# Patient Record
Sex: Male | Born: 1945 | Race: White | Hispanic: No | Smoking: Current every day smoker
Health system: Southern US, Community
[De-identification: ages and names within clinical notes are randomized; demographics above are authoritative.]

## PROBLEM LIST (undated history)

## (undated) DIAGNOSIS — J449 Chronic obstructive pulmonary disease, unspecified: Secondary | ICD-10-CM

## (undated) DIAGNOSIS — I10 Essential (primary) hypertension: Secondary | ICD-10-CM

## (undated) HISTORY — PX: BACK SURGERY: SHX140

## (undated) HISTORY — PX: PERCUTANEOUS PLACEMENT INTRAVASCULAR STENT CERVICAL CAROTID ARTERY: SUR1019

## (undated) HISTORY — PX: CARDIAC SURGERY: SHX584

## (undated) HISTORY — PX: CORONARY ARTERY BYPASS GRAFT: SHX141

---

## 2013-07-26 DEATH — deceased

## 2016-06-05 ENCOUNTER — Inpatient Hospital Stay (HOSPITAL_COMMUNITY)
Admission: EM | Admit: 2016-06-05 | Discharge: 2016-06-10 | DRG: 154 | Disposition: A | Discharge status: Home / Self Care | Payer: Medicare Other | Attending: Internal Medicine | Admitting: Internal Medicine

## 2016-06-05 ENCOUNTER — Emergency Department (HOSPITAL_COMMUNITY): Payer: Medicare Other

## 2016-06-05 ENCOUNTER — Encounter (HOSPITAL_COMMUNITY): Payer: Self-pay | Admitting: Emergency Medicine

## 2016-06-05 DIAGNOSIS — G9340 Encephalopathy, unspecified: Secondary | ICD-10-CM | POA: Diagnosis not present

## 2016-06-05 DIAGNOSIS — G92 Toxic encephalopathy: Secondary | ICD-10-CM | POA: Diagnosis present

## 2016-06-05 DIAGNOSIS — J988 Other specified respiratory disorders: Secondary | ICD-10-CM

## 2016-06-05 DIAGNOSIS — T380X5A Adverse effect of glucocorticoids and synthetic analogues, initial encounter: Secondary | ICD-10-CM | POA: Diagnosis present

## 2016-06-05 DIAGNOSIS — J029 Acute pharyngitis, unspecified: Secondary | ICD-10-CM | POA: Diagnosis present

## 2016-06-05 DIAGNOSIS — E876 Hypokalemia: Secondary | ICD-10-CM | POA: Diagnosis present

## 2016-06-05 DIAGNOSIS — J432 Centrilobular emphysema: Secondary | ICD-10-CM | POA: Diagnosis present

## 2016-06-05 DIAGNOSIS — J449 Chronic obstructive pulmonary disease, unspecified: Secondary | ICD-10-CM | POA: Diagnosis present

## 2016-06-05 DIAGNOSIS — I6529 Occlusion and stenosis of unspecified carotid artery: Secondary | ICD-10-CM | POA: Diagnosis present

## 2016-06-05 DIAGNOSIS — Z880 Allergy status to penicillin: Secondary | ICD-10-CM | POA: Diagnosis not present

## 2016-06-05 DIAGNOSIS — Z01818 Encounter for other preprocedural examination: Secondary | ICD-10-CM

## 2016-06-05 DIAGNOSIS — F17200 Nicotine dependence, unspecified, uncomplicated: Secondary | ICD-10-CM | POA: Diagnosis not present

## 2016-06-05 DIAGNOSIS — Z885 Allergy status to narcotic agent status: Secondary | ICD-10-CM | POA: Diagnosis not present

## 2016-06-05 DIAGNOSIS — A419 Sepsis, unspecified organism: Secondary | ICD-10-CM | POA: Diagnosis not present

## 2016-06-05 DIAGNOSIS — R061 Stridor: Secondary | ICD-10-CM

## 2016-06-05 DIAGNOSIS — F17210 Nicotine dependence, cigarettes, uncomplicated: Secondary | ICD-10-CM | POA: Diagnosis present

## 2016-06-05 DIAGNOSIS — R1310 Dysphagia, unspecified: Secondary | ICD-10-CM | POA: Diagnosis present

## 2016-06-05 DIAGNOSIS — J392 Other diseases of pharynx: Secondary | ICD-10-CM | POA: Diagnosis present

## 2016-06-05 DIAGNOSIS — R0600 Dyspnea, unspecified: Secondary | ICD-10-CM | POA: Diagnosis not present

## 2016-06-05 DIAGNOSIS — Z23 Encounter for immunization: Secondary | ICD-10-CM

## 2016-06-05 DIAGNOSIS — Z9911 Dependence on respirator [ventilator] status: Secondary | ICD-10-CM | POA: Diagnosis not present

## 2016-06-05 DIAGNOSIS — I110 Hypertensive heart disease with heart failure: Secondary | ICD-10-CM | POA: Diagnosis present

## 2016-06-05 DIAGNOSIS — I5022 Chronic systolic (congestive) heart failure: Secondary | ICD-10-CM | POA: Diagnosis present

## 2016-06-05 DIAGNOSIS — J9601 Acute respiratory failure with hypoxia: Secondary | ICD-10-CM | POA: Diagnosis present

## 2016-06-05 DIAGNOSIS — IMO0001 Reserved for inherently not codable concepts without codable children: Secondary | ICD-10-CM | POA: Diagnosis present

## 2016-06-05 DIAGNOSIS — J42 Unspecified chronic bronchitis: Secondary | ICD-10-CM | POA: Diagnosis not present

## 2016-06-05 DIAGNOSIS — Z9289 Personal history of other medical treatment: Secondary | ICD-10-CM

## 2016-06-05 DIAGNOSIS — I6522 Occlusion and stenosis of left carotid artery: Secondary | ICD-10-CM

## 2016-06-05 HISTORY — DX: Essential (primary) hypertension: I10

## 2016-06-05 HISTORY — DX: Chronic obstructive pulmonary disease, unspecified: J44.9

## 2016-06-05 LAB — CBC WITH DIFFERENTIAL/PLATELET
Band Neutrophils: 0 %
Basophils Absolute: 0 10*3/uL (ref 0.0–0.1)
Basophils Relative: 0 %
Blasts: 0 %
Eosinophils Absolute: 0 10*3/uL (ref 0.0–0.7)
Eosinophils Relative: 0 %
HCT: 37.1 % — ABNORMAL LOW (ref 39.0–52.0)
Hemoglobin: 12.3 g/dL — ABNORMAL LOW (ref 13.0–17.0)
Lymphocytes Relative: 17 %
Lymphs Abs: 5 10*3/uL — ABNORMAL HIGH (ref 0.7–4.0)
MCH: 28.6 pg (ref 26.0–34.0)
MCHC: 33.2 g/dL (ref 30.0–36.0)
MCV: 86.3 fL (ref 78.0–100.0)
Metamyelocytes Relative: 0 %
Monocytes Absolute: 2.3 10*3/uL — ABNORMAL HIGH (ref 0.1–1.0)
Monocytes Relative: 8 %
Myelocytes: 0 %
Neutro Abs: 21.9 10*3/uL — ABNORMAL HIGH (ref 1.7–7.7)
Neutrophils Relative %: 75 %
Platelets: 520 10*3/uL — ABNORMAL HIGH (ref 150–400)
Promyelocytes Absolute: 0 %
RBC: 4.3 MIL/uL (ref 4.22–5.81)
RDW: 13.5 % (ref 11.5–15.5)
WBC: 29.2 10*3/uL — ABNORMAL HIGH (ref 4.0–10.5)
nRBC: 0 /100 WBC

## 2016-06-05 LAB — BLOOD GAS, ARTERIAL
Acid-Base Excess: 3.7 mmol/L — ABNORMAL HIGH (ref 0.0–2.0)
Bicarbonate: 27.8 mmol/L (ref 20.0–28.0)
Drawn by: 27407
FIO2: 40
MECHVT: 400 mL
O2 Saturation: 97.9 %
PEEP: 5 cmH2O
Patient temperature: 37
RATE: 26 resp/min
pCO2 arterial: 39.4 mmHg (ref 32.0–48.0)
pH, Arterial: 7.457 — ABNORMAL HIGH (ref 7.350–7.450)
pO2, Arterial: 120 mmHg — ABNORMAL HIGH (ref 83.0–108.0)

## 2016-06-05 LAB — BASIC METABOLIC PANEL
Anion gap: 12 (ref 5–15)
BUN: 25 mg/dL — ABNORMAL HIGH (ref 6–20)
CO2: 29 mmol/L (ref 22–32)
Calcium: 7 mg/dL — ABNORMAL LOW (ref 8.9–10.3)
Chloride: 94 mmol/L — ABNORMAL LOW (ref 101–111)
Creatinine, Ser: 1.01 mg/dL (ref 0.61–1.24)
GFR calc Af Amer: >60 mL/min (ref >60)
GFR calc non Af Amer: >60 mL/min (ref >60)
Glucose, Bld: 90 mg/dL (ref 65–99)
Potassium: 2.9 mmol/L — ABNORMAL LOW (ref 3.5–5.1)
Sodium: 135 mmol/L (ref 135–145)

## 2016-06-05 LAB — MAGNESIUM: MAGNESIUM: 0.5 mg/dL — AB (ref 1.7–2.4)

## 2016-06-05 LAB — PHOSPHORUS: PHOSPHORUS: 4 mg/dL (ref 2.5–4.6)

## 2016-06-05 MED ORDER — PROPOFOL 1000 MG/100ML IV EMUL
INTRAVENOUS | Status: AC
Start: 2016-06-05 — End: 2016-06-05
  Filled 2016-06-05: qty 100

## 2016-06-05 MED ORDER — FAMOTIDINE IN NACL 20-0.9 MG/50ML-% IV SOLN
20.0000 mg | Freq: Two times a day (BID) | INTRAVENOUS | Status: DC
  Administered 2016-06-05 – 2016-06-07 (×4): 20 mg via INTRAVENOUS
  Filled 2016-06-05 (×4): qty 50

## 2016-06-05 MED ORDER — IPRATROPIUM-ALBUTEROL 0.5-2.5 (3) MG/3ML IN SOLN
3.0000 mL | Freq: Four times a day (QID) | RESPIRATORY_TRACT | Status: DC
  Administered 2016-06-05 – 2016-06-10 (×18): 3 mL via RESPIRATORY_TRACT
  Filled 2016-06-05 (×18): qty 3

## 2016-06-05 MED ORDER — METHYLPREDNISOLONE SODIUM SUCC 125 MG IJ SOLR
INTRAMUSCULAR | Status: AC
  Administered 2016-06-05: 125 mg via INTRAVENOUS
  Filled 2016-06-05: qty 2

## 2016-06-05 MED ORDER — MAGNESIUM SULFATE 2 GM/50ML IV SOLN
2.0000 g | Freq: Once | INTRAVENOUS | Status: AC
  Administered 2016-06-05: 2 g via INTRAVENOUS
  Filled 2016-06-05: qty 50

## 2016-06-05 MED ORDER — DIPHENHYDRAMINE HCL 50 MG/ML IJ SOLN
INTRAMUSCULAR | Status: AC
  Administered 2016-06-05: 50 mg via INTRAVENOUS
  Filled 2016-06-05: qty 1

## 2016-06-05 MED ORDER — IOPAMIDOL (ISOVUE-300) INJECTION 61%
75.0000 mL | Freq: Once | INTRAVENOUS | Status: AC | PRN
  Administered 2016-06-05: 75 mL via INTRAVENOUS

## 2016-06-05 MED ORDER — SODIUM CHLORIDE 0.9 % IV SOLN
25.0000 ug/h | INTRAVENOUS | Status: DC
  Filled 2016-06-05: qty 50

## 2016-06-05 MED ORDER — PROPOFOL 1000 MG/100ML IV EMUL
INTRAVENOUS | Status: AC
  Administered 2016-06-05: 5 ug/kg/min via INTRAVENOUS
  Filled 2016-06-05: qty 100

## 2016-06-05 MED ORDER — KETAMINE HCL 10 MG/ML IJ SOLN
INTRAMUSCULAR | Status: AC
  Administered 2016-06-05: 60 mg via INTRAVENOUS
  Filled 2016-06-05: qty 1

## 2016-06-05 MED ORDER — AZTREONAM 1 G IJ SOLR
1.0000 g | Freq: Three times a day (TID) | INTRAMUSCULAR | Status: DC
  Administered 2016-06-05 – 2016-06-09 (×12): 1 g via INTRAVENOUS
  Filled 2016-06-05 (×19): qty 1

## 2016-06-05 MED ORDER — CLINDAMYCIN PHOSPHATE 600 MG/50ML IV SOLN
600.0000 mg | Freq: Once | INTRAVENOUS | Status: AC
  Administered 2016-06-05: 600 mg via INTRAVENOUS
  Filled 2016-06-05: qty 50

## 2016-06-05 MED ORDER — MIDAZOLAM BOLUS VIA INFUSION
1.0000 mg | INTRAVENOUS | Status: DC | PRN
  Filled 2016-06-05: qty 1

## 2016-06-05 MED ORDER — DEXTROSE 5 % IV SOLN
INTRAVENOUS | Status: AC
  Filled 2016-06-05 (×2): qty 1

## 2016-06-05 MED ORDER — LORAZEPAM 2 MG/ML IJ SOLN: 1.0000 mg | Freq: Once | INTRAMUSCULAR | Status: DC

## 2016-06-05 MED ORDER — VANCOMYCIN HCL IN DEXTROSE 1-5 GM/200ML-% IV SOLN
INTRAVENOUS | Status: AC
  Filled 2016-06-05: qty 200

## 2016-06-05 MED ORDER — MAGNESIUM SULFATE 2 GM/50ML IV SOLN
4.0000 g | INTRAVENOUS | Status: AC
  Filled 2016-06-05: qty 100

## 2016-06-05 MED ORDER — ENOXAPARIN SODIUM 40 MG/0.4ML ~~LOC~~ SOLN
40.0000 mg | SUBCUTANEOUS | Status: DC
  Administered 2016-06-05 – 2016-06-09 (×5): 40 mg via SUBCUTANEOUS
  Filled 2016-06-05 (×5): qty 0.4

## 2016-06-05 MED ORDER — MAGNESIUM SULFATE 4 GM/100ML IV SOLN
4.0000 g | INTRAVENOUS | Status: DC
  Administered 2016-06-05 – 2016-06-06 (×2): 4 g via INTRAVENOUS
  Filled 2016-06-05 (×2): qty 100

## 2016-06-05 MED ORDER — ONDANSETRON HCL 4 MG/2ML IJ SOLN: 4.0000 mg | Freq: Four times a day (QID) | INTRAMUSCULAR | Status: DC | PRN

## 2016-06-05 MED ORDER — FAMOTIDINE IN NACL 20-0.9 MG/50ML-% IV SOLN
20.0000 mg | Freq: Once | INTRAVENOUS | Status: AC
  Administered 2016-06-05: 20 mg via INTRAVENOUS
  Filled 2016-06-05: qty 50

## 2016-06-05 MED ORDER — SODIUM CHLORIDE 0.9 % IV SOLN
INTRAVENOUS | Status: DC
  Administered 2016-06-05: 14:00:00 via INTRAVENOUS

## 2016-06-05 MED ORDER — SODIUM CHLORIDE 0.9 % IV BOLUS (SEPSIS)
1000.0000 mL | Freq: Once | INTRAVENOUS | Status: AC
  Administered 2016-06-05: 1000 mL via INTRAVENOUS

## 2016-06-05 MED ORDER — VANCOMYCIN HCL IN DEXTROSE 1-5 GM/200ML-% IV SOLN
1000.0000 mg | Freq: Once | INTRAVENOUS | Status: AC
  Administered 2016-06-05: 1000 mg via INTRAVENOUS

## 2016-06-05 MED ORDER — DIPHENHYDRAMINE HCL 50 MG/ML IJ SOLN
50.0000 mg | Freq: Once | INTRAMUSCULAR | Status: AC
  Administered 2016-06-05: 50 mg via INTRAVENOUS

## 2016-06-05 MED ORDER — SODIUM CHLORIDE 0.9 % IV SOLN
1.0000 mg/h | INTRAVENOUS | Status: DC
Start: 2016-06-05 — End: 2016-06-07
  Administered 2016-06-05: 2 mg/h via INTRAVENOUS
  Administered 2016-06-05: 1 mg/h via INTRAVENOUS
  Administered 2016-06-06: 7 mg/h via INTRAVENOUS
  Administered 2016-06-06: 6 mg/h via INTRAVENOUS
  Administered 2016-06-06: 1 mg/h via INTRAVENOUS
  Administered 2016-06-06 (×2): 6 mg/h via INTRAVENOUS
  Administered 2016-06-07: 7 mg/h via INTRAVENOUS
  Filled 2016-06-05 (×4): qty 10

## 2016-06-05 MED ORDER — ROCURONIUM BROMIDE 50 MG/5ML IV SOLN: 1.0000 mg/kg | Freq: Once | INTRAVENOUS | Status: DC

## 2016-06-05 MED ORDER — MIDAZOLAM 50MG/50ML (1MG/ML) PREMIX INFUSION
INTRAVENOUS | Status: AC
  Filled 2016-06-05: qty 50

## 2016-06-05 MED ORDER — ONDANSETRON HCL 4 MG PO TABS: 4.0000 mg | ORAL_TABLET | Freq: Four times a day (QID) | ORAL | Status: DC | PRN

## 2016-06-05 MED ORDER — METHYLPREDNISOLONE SODIUM SUCC 125 MG IJ SOLR
125.0000 mg | Freq: Once | INTRAMUSCULAR | Status: AC
  Administered 2016-06-05: 125 mg via INTRAVENOUS

## 2016-06-05 MED ORDER — VANCOMYCIN HCL 500 MG IV SOLR
500.0000 mg | Freq: Two times a day (BID) | INTRAVENOUS | Status: DC
  Administered 2016-06-06 – 2016-06-09 (×7): 500 mg via INTRAVENOUS
  Filled 2016-06-05 (×10): qty 500

## 2016-06-05 MED ORDER — LORAZEPAM 2 MG/ML IJ SOLN
INTRAMUSCULAR | Status: AC
  Filled 2016-06-05: qty 1

## 2016-06-05 MED ORDER — METHYLPREDNISOLONE SODIUM SUCC 125 MG IJ SOLR
60.0000 mg | Freq: Three times a day (TID) | INTRAMUSCULAR | Status: DC
  Administered 2016-06-05 – 2016-06-07 (×5): 60 mg via INTRAVENOUS
  Filled 2016-06-05 (×5): qty 2

## 2016-06-05 MED ORDER — EPINEPHRINE 0.3 MG/0.3ML IJ SOAJ
0.3000 mg | Freq: Once | INTRAMUSCULAR | Status: AC
  Administered 2016-06-05: 0.3 mg via INTRAMUSCULAR

## 2016-06-05 MED ORDER — IPRATROPIUM BROMIDE 0.02 % IN SOLN: 0.5000 mg | Freq: Four times a day (QID) | RESPIRATORY_TRACT | Status: DC

## 2016-06-05 MED ORDER — KETAMINE HCL 10 MG/ML IJ SOLN
1.0000 mg/kg | Freq: Once | INTRAMUSCULAR | Status: AC
  Administered 2016-06-05: 60 mg via INTRAVENOUS

## 2016-06-05 MED ORDER — POTASSIUM CHLORIDE 10 MEQ/100ML IV SOLN
10.0000 meq | INTRAVENOUS | Status: AC
  Administered 2016-06-05 (×3): 10 meq via INTRAVENOUS
  Filled 2016-06-05 (×2): qty 100

## 2016-06-05 MED ORDER — KETOROLAC TROMETHAMINE 30 MG/ML IJ SOLN
15.0000 mg | Freq: Once | INTRAMUSCULAR | Status: AC
  Administered 2016-06-05: 15 mg via INTRAVENOUS
  Filled 2016-06-05: qty 1

## 2016-06-05 MED ORDER — PROPOFOL 1000 MG/100ML IV EMUL
5.0000 ug/kg/min | Freq: Once | INTRAVENOUS | Status: AC
  Administered 2016-06-05: 5 ug/kg/min via INTRAVENOUS

## 2016-06-05 MED ORDER — PROPOFOL 1000 MG/100ML IV EMUL
5.0000 ug/kg/min | INTRAVENOUS | Status: DC
  Administered 2016-06-05: 20:00:00 via INTRAVENOUS
  Administered 2016-06-06: 22 ug/kg/min via INTRAVENOUS

## 2016-06-05 MED ORDER — FENTANYL CITRATE (PF) 100 MCG/2ML IJ SOLN
INTRAMUSCULAR | Status: AC
  Administered 2016-06-05: 18:00:00
  Filled 2016-06-05: qty 2

## 2016-06-05 MED ORDER — KETOROLAC TROMETHAMINE 30 MG/ML IJ SOLN: 30.0000 mg | Freq: Once | INTRAMUSCULAR | Status: DC

## 2016-06-05 MED ORDER — KETOROLAC TROMETHAMINE 15 MG/ML IJ SOLN
15.0000 mg | Freq: Four times a day (QID) | INTRAMUSCULAR | Status: DC
  Administered 2016-06-05 – 2016-06-06 (×2): 15 mg via INTRAVENOUS
  Filled 2016-06-05 (×2): qty 1

## 2016-06-05 MED ORDER — ALBUTEROL SULFATE (2.5 MG/3ML) 0.083% IN NEBU: 2.5000 mg | INHALATION_SOLUTION | Freq: Four times a day (QID) | RESPIRATORY_TRACT | Status: DC

## 2016-06-05 MED ORDER — ALBUTEROL SULFATE (2.5 MG/3ML) 0.083% IN NEBU: 2.5000 mg | INHALATION_SOLUTION | RESPIRATORY_TRACT | Status: DC | PRN

## 2016-06-05 MED ORDER — EPINEPHRINE 0.3 MG/0.3ML IJ SOAJ
INTRAMUSCULAR | Status: AC
  Administered 2016-06-05: 0.3 mg via INTRAMUSCULAR
  Filled 2016-06-05: qty 0.3

## 2016-06-05 MED ORDER — DEXTROSE 5 % IV SOLN: 1.0000 g | INTRAVENOUS | Status: DC

## 2016-06-05 MED ORDER — ROCURONIUM BROMIDE 50 MG/5ML IV SOLN
50.0000 mg | Freq: Once | INTRAVENOUS | Status: AC
  Administered 2016-06-05: 50 mg via INTRAVENOUS
  Filled 2016-06-05: qty 5

## 2016-06-05 MED ORDER — POTASSIUM CHLORIDE IN NACL 40-0.9 MEQ/L-% IV SOLN
INTRAVENOUS | Status: DC
  Administered 2016-06-05 – 2016-06-06 (×2): 125 mL/h via INTRAVENOUS
  Filled 2016-06-05: qty 1000

## 2016-06-05 NOTE — ED Notes (Signed)
Pt continued to be very alert, seemed uncomfortable and attempting to pull ETT.  Pt also has lots of secretions.  RT called and superficial suctioning until they get here.  Propofol increased to MAx 4880mcg/kg/min and EDP notified of lack of sufficient seadation.  Pt spo2 91% while he is alert and pulling for tube, RT came to suction pt with some success and spo2 increased and pt seemed calmer.  V.O for 10mcg fentanyl and 1mg  ativan from Dr. Juleen ChinaKohut.

## 2016-06-05 NOTE — ED Triage Notes (Addendum)
[facility]. Pt reports was given a breathing treatment with no relief on Sunday. Moderate dyspnea noted at this time. Moderate swelling to tongue. Primary RN and EDP aware.   Pt reported just finished a 10 day supply of levaquin.

## 2016-06-05 NOTE — ED Notes (Signed)
Pt is calm and resting, VSS, tube placement was confirmed by auscultation and CO2 change.  OG was also placed by Dr. Juleen ChinaKohut.  Pt significant other remains at the bedside.  Dr. Juleen ChinaKohut informed her of plan of care.  Will continue to monitor

## 2016-06-05 NOTE — Progress Notes (Signed)
Pt transported from ED to ICU on vent.  RT will continue to monitor.

## 2016-06-05 NOTE — ED Provider Notes (Signed)
[facility]-EMERGENCY DEPT Provider Note   CSN: [id number] Arrival date & time: 06/05/16  1324   By signing my name below, I, Clarence Bradshaw, attest that this documentation has been prepared under the direction and in the presence of Clarence RazorStephen Yecheskel Kurek, MD  Electronically Signed: Clovis PuAvnee Bradshaw, ED Scribe. 06/05/16. 1:40 PM.   History   Chief Complaint Chief Complaint  Patient presents with  . Shortness of Breath   The history is provided by the patient and the spouse. No language interpreter was used.   HPI Comments:  Clarence Bradshaw is a 70 y.o. male, with a hx fo HTN and COPD, who presents to the Emergency Department complaining of worsening SOB x several days. Pt also endorses throat tightness, abdominal pain, nausea, vomiting, and light-headedness. Wife states pt was given a new medication, levofloxacin, which has been making his vomit. [facility] similar symptoms 2 days ago where he was given a breathing treatment with no relief. No alleviating factors noted. Pt denies diarrhea, any other associated symptoms and modifying factors at this time.   Past Medical History:  Diagnosis Date  . COPD (chronic obstructive pulmonary disease) (HCC)   . Hypertension     There are no active problems to display for this patient.   Past Surgical History:  Procedure Laterality Date  . BACK SURGERY    . CARDIAC SURGERY    . CORONARY ARTERY BYPASS GRAFT     x3  . PERCUTANEOUS PLACEMENT INTRAVASCULAR STENT CERVICAL CAROTID ARTERY       Home Medications    Prior to Admission medications   Not on File    Family History History reviewed. No pertinent family history.  Social History Social History  Substance Use Topics  . Smoking status: Current Every Day Smoker    Packs/day: 1.00    Types: Cigarettes  . Smokeless tobacco: Former NeurosurgeonUser  . Alcohol use No     Allergies   Demerol [meperidine] and Penicillins   Review of Systems Review of Systems  HENT: Positive  for voice change (muffled voice).   Respiratory: Positive for shortness of breath.   Gastrointestinal: Positive for abdominal pain, nausea and vomiting.  Neurological: Positive for light-headedness.  All other systems reviewed and are negative.  Physical Exam Updated Vital Signs BP 149/83 (BP Location: Left Arm)   Pulse 98   Resp 22   SpO2 97%   Physical Exam  Constitutional: He is oriented to person, place, and time. He appears well-developed and well-nourished.  Muffled voice  HENT:  Head: Normocephalic.  Mouth/Throat: Uvula is midline. Posterior oropharyngeal edema (mild) present.  Exudative pharyngitis. Uvula midline. Edema of posterior pharynx. Tongue grossly normal. Some pooling of secretions. Muffled sounding voice.   Eyes: Conjunctivae are normal.  Neck: Neck supple. No tracheal deviation present.  Cardiovascular: Normal rate.   Pulmonary/Chest: Effort normal. Stridor present.  Stridor. Lung sounds clear b/l.   Abdominal: He exhibits no distension.  Neurological: He is alert and oriented to person, place, and time.  Skin: Skin is warm and dry.  Psychiatric: He has a normal mood and affect.  Nursing note and vitals reviewed.   ED Treatments / Results  DIAGNOSTIC STUDIES:  Oxygen Saturation is 97% on RA, normal by my interpretation.    COORDINATION OF CARE:  1:37 PM Discussed treatment plan with pt at bedside and pt agreed to plan.  Labs (all labs ordered are listed, but only abnormal results are displayed) Labs Reviewed - No data to  display  EKG  EKG Interpretation None       Radiology Ct Soft Tissue Neck W Contrast  Result Date: 06/05/2016 CLINICAL DATA:  Shortness of breath, globus sensation for 3 days. Neck pain and stridor. History of COPD. EXAM: CT NECK WITH CONTRAST TECHNIQUE: Multidetector CT imaging of the neck was performed using the standard protocol following the bolus administration of intravenous contrast. CONTRAST:  75mL ISOVUE-300  IOPAMIDOL (ISOVUE-300) INJECTION 61% COMPARISON:  None. FINDINGS: Pharynx and larynx: Severely enlarged adenoidal soft tissues, with fullness of Waldeyer's ring. Secretions in the hypopharynx. Mildly edematous epiglottis, apposition of the true vocal cords and false vocal cords. No radiopaque foreign bodies or focal fluid collection. Salivary glands: Symmetrically diminutive parotid glands with prominent accessory parotid tissue. No acute process. Thyroid: Normal. Lymph nodes: Vascular: Status post RIGHT carotid endarterectomy. Calcific atherosclerosis results and suspected severe stenosis LEFT internal carotid artery origin. Severe calcific atherosclerosis of the aortic arch. Limited intracranial: Severe calcific atherosclerosis of the carotid siphons with suspected stenosis RIGHT supraclinoid internal carotid artery. Old small LEFT cerebellar infarct. Visualized orbits: Old LEFT medial orbital blowout fracture. Mastoids and visualized paranasal sinuses: Severe bilateral maxillary sinus soft tissue opacification with mucoperiosteal reaction. Mild lobulated ethmoid mucosal thickening. Skeleton: Status post median sternotomy. Multilevel severe/ moderate to severe degenerative discs and severe facet arthropathy. Severe LEFT C3-4, severe RIGHT and LEFT C4-5, moderate to severe C5-6 neural foraminal narrowing. Upper chest: Partially imaged centrilobular emphysema. Subcentimeter mediastinal lymph nodes are likely reactive. Other: IMPRESSION: Severe suspected pharyngitis/laryngitis with apposition of the true vocal cords, which may be due to edema or phonation. Given degree of pharyngeal enlargement though unlikely, lymphoproliferative disease is a possibility. Severe chronic maxillary sinusitis. Severe intracranial atherosclerosis with suspected stenosis RIGHT supraclinoid internal carotid artery. Probable severe LEFT internal carotid artery stenosis. Acute findings discussed with and reconfirmed by Dr.Danyeal Akens on  06/05/2016 at 4:10 pm. Electronically Signed   By: Clarence Metroourtnay  Bradshaw M.D.   On: 06/05/2016 16:12   Dg Chest Portable 1 View  Result Date: 06/05/2016 CLINICAL DATA:  Shortness of breath, COPD, hypertension, smoker, prior CABG, intubation EXAM: PORTABLE CHEST 1 VIEW COMPARISON:  Portable exam 1627 hours without priors for comparison FINDINGS: Nasogastric tube extends into stomach. No endotracheal tube identified. Normal heart size post median sternotomy. Mediastinal contours and pulmonary vascularity normal. Atherosclerotic calcification aorta. Bronchitic changes with bibasilar opacities which could represent atelectasis or infiltrate, greater on LEFT. Upper lungs clear. No pleural effusion or pneumothorax. IMPRESSION: Bibasilar atelectasis versus infiltrate LEFT greater than RIGHT. Aortic atherosclerosis. No endotracheal tube identified. Findings discussed with Dr. Juleen ChinaKohut on 06/05/2016 at 1650 hours. Electronically Signed   By: Ulyses SouthwardMark  Boles M.D.   On: 06/05/2016 16:51    Procedures OG placement Date/Time: 06/05/2016 4:00 PM Performed by: Clarence RazorKOHUT, Astaria Nanez Authorized by: Clarence RazorKOHUT, Sheyla Zaffino  Consent: The procedure was performed in an emergent situation. Patient identity confirmed: provided demographic data and arm band Local anesthesia used: no  Anesthesia: Local anesthesia used: no  Sedation: Patient sedated: yes Sedatives: see MAR for details Vitals: Vital signs were monitored during sedation. Patient tolerance: Patient tolerated the procedure well with no immediate complications     CRITICAL CARE Performed by: Clarence RazorKOHUT, Analiah Drum Total critical care time: 80 minutes Critical care time was exclusive of separately billable procedures and treating other patients. Critical care was necessary to treat or prevent imminent or life-threatening deterioration. Critical care was time spent personally by me on the following activities: development of treatment plan with patient and/or surrogate as well  as  nursing, discussions with consultants, evaluation of patient's response to treatment, examination of patient, obtaining history from patient or surrogate, ordering and performing treatments and interventions, ordering and review of laboratory studies, ordering and review of radiographic studies, pulse oximetry and re-evaluation of patient's condition.    INTUBATION Performed by: Clarence Razor  Required items: required blood products, implants, devices, and special equipment available Patient identity confirmed: provided demographic data and hospital-assigned identification number Time out: Immediately prior to procedure a "time out" was called to verify the correct patient, procedure, equipment, support staff and site/side marked as required.  Indications: airway protection.   Intubation method: Glidescope Laryngoscopy   Preoxygenation: BVM  Sedatives:ketmaineParalytic:   Tube Size: 6.0 cuffed.   Airway extremely edematous. I could not visualize cords with either glidescope or direct laryngoscopy. Swollen arytenoids identified and ETT directed through expected course of airway.   Post-procedure assessment: chest rise and ETCO2 monitor  Breath sounds: equal and absent over the epigastrium Tube secured with: ETT holder  Chest x-ray interpreted by radiologist and me.  Chest x-ray findings: ETT is not clearly identified. There is no stripe on the ETT. Clinically it is in the trachea. +capnography. B/l breath sounds. Symmetric chest rise. o2 sats 100%.  Patient tolerated the procedure well with no immediate complications.      Medications Ordered in ED Medications  0.9 %  sodium chloride infusion ( Intravenous New Bag/Given 06/05/16 1344)  potassium chloride 10 mEq in 100 mL IVPB (not administered)  magnesium sulfate IVPB 2 g 50 mL (not administered)  rocuronium (ZEMURON) injection 59 mg (not administered)  famotidine (PEPCID) IVPB 20 mg premix (0 mg Intravenous Stopped  06/05/16 1421)  methylPREDNISolone sodium succinate (SOLU-MEDROL) 125 mg/2 mL injection 125 mg (125 mg Intravenous Given 06/05/16 1342)  diphenhydrAMINE (BENADRYL) injection 50 mg (50 mg Intravenous Given 06/05/16 1342)  EPINEPHrine (EPI-PEN) injection 0.3 mg (0.3 mg Intramuscular Given 06/05/16 1344)  clindamycin (CLEOCIN) IVPB 600 mg (0 mg Intravenous Stopped 06/05/16 1600)  iopamidol (ISOVUE-300) 61 % injection 75 mL (75 mLs Intravenous Contrast Given 06/05/16 1521)  ketamine (KETALAR) injection 1 mg/kg (60 mg Intravenous Given 06/05/16 1617)  propofol (DIPRIVAN) 1000 MG/100ML infusion (10 mcg/kg/min  59 kg Intravenous Rate/Dose Change 06/05/16 1634)     Initial Impression / Assessment and Plan / ED Course  I have reviewed the triage vital signs and the nursing notes.  Pertinent labs & imaging results that were available during my care of the patient were reviewed by me and considered in my medical decision making (see chart for details).  Clinical Course    70yM with throat pain/tightness. Arrived with audible stridor, muffled voice and some pooling of secretions. Given epinephrine, steroids, antihistamines and empiric abx. Clindamycin with reported PCN allergy. Imaging as above. Elected to intubated for airway protection. Surgery and anesthesia at bedside for backup. I was unable to actually visualize his vocal cords 2/2 severe swelling but could identify edematous arytenoids and thankfully able to pass an ETT. You cannot clearly identify ETT on CXR. The tube doesn't have a radiopaque stripe on it. Clinically it is well positioned.   Final Clinical Impressions(s) / ED Diagnoses   Final diagnoses:  Compromised airway  Pharyngitis, unspecified etiology  Hypokalemia    New Prescriptions New Prescriptions   No medications on file    I personally preformed the services scribed in my presence. The recorded information has been reviewed is accurate. Clarence Razor, MD.     Clarence Razor, MD 06/05/16  1745  

## 2016-06-05 NOTE — ED Notes (Addendum)
 50mg  Ketamine administered  IV per verbal of EDP by Desma PaganiniEllen, F. RN. Pt intubated with 6.0 ET tube, secured 22cm at the lip by Dr. Juleen ChinaKohut. Dr. Juleen ChinaKohut gave verbal order to administer 50mg  Rocuronium IV. Rocuronium administered by Ellen,F. RN  Dr. Juleen ChinaKohut attempting to place OG tube. Xray notified of intubation.

## 2016-06-05 NOTE — ED Notes (Signed)
X-ray at bedside

## 2016-06-05 NOTE — Progress Notes (Signed)
Chest xray could not confirm ET tube placement. Dayshift RT was worried that size 6.0 ET tube was smaller length wise than a 7.0 or 7.5. I compared the length of all tubes and 22cm is the same on all tubes. No further action taken at this time. Will continue to monitor. Santiago BurBridget Tymere Depuy RRT, RCP

## 2016-06-05 NOTE — Progress Notes (Signed)
eLink Physician-Brief Progress Note Patient Name: Omar PersonRichard Deines DOB: 04/29/1946 MRN: [id number]   Date of Service  06/05/2016  HPI/Events of Note  70 yo male admitted with Pharengitis. Intubated and ventilated for airway compromise. BP = 82/49 and HR = 60. Currently on a Propofol IV infusion. Allergy to Demerol, therefore, can't use a Fentanyl IV infusion.   eICU Interventions  Will order: 1. Bolus with 0.9 NaCl 1 liter IV over 1 hour now.  2. Versed IV infusion. Titrate to RASS = -1. 3. Wean Propofol IV infusion off.      Intervention Category Evaluation Type: New Patient Evaluation  Lenell AntuSommer,Berdell Nevitt Eugene 06/05/2016, 10:28 PM

## 2016-06-05 NOTE — H&P (Signed)
History and Physical    Blong Busk WUJ:[id number] DOB: May 29, 1946 DOA: 06/05/2016  PCP: [facility]   Patient coming from: Home.  Chief Complaint: Shortness of breath.  HPI: Clarence Bradshaw is a 70 y.o. male with medical history significant of COPD, hypertension was coming to the emergency department due to progressively worse dyspnea, wheezing, sore throat, abdominal pain, nausea and emesis for several days.  According to the patient's friend, Arbie Cookey, who was present in the room at the time of my examination, the patient was started on a Levaquin course about 9 days ago for sore throat which has not worked. He has been vomiting multiple times a day, but today he has numerous times of emesis. No documented fever or chills, but positive fatigue. On Sunday, he went to the [facility] due to dyspnea. There, he received a nebulizer treatment and was checked for strep throat and they were told it was negative. Today he came to the emergency department due to dyspnea and had to be intubated for airway protection due to the large size of his tonsils.  ED Course: Workup in the emergency department showed a white count of 29.2, hemoglobin level XII.3 g/dL, platelets of 956. Potassium was 2.9 mmol/L and BUN was 25 mg/mL. The patient received supplemental oxygen, IV fluid boluses, IV clindamycin 600 mg, potassium supplementation, albuterol nebulizer, Solu-Medrol 125 mg IVP, Benadryl 50 mg IVP, famotidine 20 mg IVP, epinephrine 0.3 mg IM, but had to be intubated for airway protection due to severe inflammation of the pharynx.  Imaging: There is bibasilar atelectasis versus infiltrate on left greater than right on chest radiograph. CT soft tissue of the neck with contrast showed show severe suspected pharyngitis/laryngitis with opposition of the true vocal cords, severe chronic maxillary sinusitis, severe intracranial atherosclerosis with suspected stenosis right  supraclinoid internal carotid artery. Also probable severe left internal carotid artery stenosis.  Review of Systems: Unable to review.    Past Medical History:  Diagnosis Date  . COPD (chronic obstructive pulmonary disease) (HCC)   . Hypertension     Past Surgical History:  Procedure Laterality Date  . BACK SURGERY    . CARDIAC SURGERY    . CORONARY ARTERY BYPASS GRAFT     x3  . PERCUTANEOUS PLACEMENT INTRAVASCULAR STENT CERVICAL CAROTID ARTERY       reports that he has been smoking Cigarettes.  He has been smoking about 1.00 pack per day. He has quit using smokeless tobacco. He reports that he does not drink alcohol. His drug history is not on file.  Allergies  Allergen Reactions  . Demerol [Meperidine]     nausea  . Penicillins     Anaphylaxis    Family history  The patient is intubated and sedated at this time. Unable to provide family medical history.  Prior to Admission medications   Not on File    Physical Exam:  Constitutional: Intubated, sedated. Vitals:   06/05/16 1755 06/05/16 1815 06/05/16 1830 06/05/16 1840  BP: (!) 86/53 (!) 89/54 (!) 79/54 (!) 83/54  Pulse: 66 66 (!) 50 (!) 59  Resp:      Temp:      TempSrc:      SpO2: 100% 100% 100% 100%  Weight:       Eyes: PERRL, lids and conjunctivae normal ENMT: ET in place. Per dr Juleen China earlier: Exudative pharyngitis. Uvula midline. Edema of posterior pharynx. Tongue grossly normal. Some pooling of secretions. Muffled sounding voice.   Neck:  normal, supple, no masses, no thyromegaly Respiratory: On mechanical assisted ventilation. clear to auscultation bilaterally, no wheezing, no crackles. No accessory muscle use.  Cardiovascular: Regular rate and rhythm, no murmurs / rubs / gallops. No extremity edema. 2+ pedal pulses.  Abdomen: Soft, no tenderness, no masses palpated. No hepatosplenomegaly. Bowel sounds positive.  Musculoskeletal: no clubbing / cyanosis. No joint deformity upper and lower extremities.  Good ROM, no contractures. Normal muscle tone.  Skin: unable to turn to evaluate the skin on back. Psych: Sedated.  Neurologic: Sedated on mechanical ventilation.    have personally reviewed following labs and imaging studies  CBC:  Recent Labs Lab 06/05/16 1352  WBC 29.2*  NEUTROABS 21.9*  HGB 12.3*  HCT 37.1*  MCV 86.3  PLT 520*   Basic Metabolic Panel:  Recent Labs Lab 06/05/16 1352 06/05/16 1749  NA 135  --   K 2.9*  --   CL 94*  --   CO2 29  --   GLUCOSE 90  --   BUN 25*  --   CREATININE 1.01  --   CALCIUM 7.0*  --   MG  --  0.5*  PHOS  --  4.0   GFR: CrCl cannot be calculated (Unknown ideal weight.). Liver Function Tests: No results for input(s): AST, ALT, ALKPHOS, BILITOT, PROT, ALBUMIN in the last 168 hours. No results for input(s): LIPASE, AMYLASE in the last 168 hours. No results for input(s): AMMONIA in the last 168 hours. Coagulation Profile: No results for input(s): INR, PROTIME in the last 168 hours. Cardiac Enzymes: No results for input(s): CKTOTAL, CKMB, CKMBINDEX, TROPONINI in the last 168 hours. BNP (last 3 results) No results for input(s): PROBNP in the last 8760 hours. HbA1C: No results for input(s): HGBA1C in the last 72 hours. CBG: No results for input(s): GLUCAP in the last 168 hours. Lipid Profile: No results for input(s): CHOL, HDL, LDLCALC, TRIG, CHOLHDL, LDLDIRECT in the last 72 hours. Thyroid Function Tests: No results for input(s): TSH, T4TOTAL, FREET4, T3FREE, THYROIDAB in the last 72 hours. Anemia Panel: No results for input(s): VITAMINB12, FOLATE, FERRITIN, TIBC, IRON, RETICCTPCT in the last 72 hours. Urine analysis: No results found for: COLORURINE, APPEARANCEUR, LABSPEC, PHURINE, GLUCOSEU, HGBUR, BILIRUBINUR, KETONESUR, PROTEINUR, UROBILINOGEN, NITRITE, LEUKOCYTESUR  Radiological Exams on Admission: Ct Soft Tissue Neck W Contrast  Result Date: 06/05/2016 CLINICAL DATA:  Shortness of breath, globus sensation for 3  days. Neck pain and stridor. History of COPD. EXAM: CT NECK WITH CONTRAST TECHNIQUE: Multidetector CT imaging of the neck was performed using the standard protocol following the bolus administration of intravenous contrast. CONTRAST:  75mL ISOVUE-300 IOPAMIDOL (ISOVUE-300) INJECTION 61% COMPARISON:  None. FINDINGS: Pharynx and larynx: Severely enlarged adenoidal soft tissues, with fullness of Waldeyer's ring. Secretions in the hypopharynx. Mildly edematous epiglottis, apposition of the true vocal cords and false vocal cords. No radiopaque foreign bodies or focal fluid collection. Salivary glands: Symmetrically diminutive parotid glands with prominent accessory parotid tissue. No acute process. Thyroid: Normal. Lymph nodes: Vascular: Status post RIGHT carotid endarterectomy. Calcific atherosclerosis results and suspected severe stenosis LEFT internal carotid artery origin. Severe calcific atherosclerosis of the aortic arch. Limited intracranial: Severe calcific atherosclerosis of the carotid siphons with suspected stenosis RIGHT supraclinoid internal carotid artery. Old small LEFT cerebellar infarct. Visualized orbits: Old LEFT medial orbital blowout fracture. Mastoids and visualized paranasal sinuses: Severe bilateral maxillary sinus soft tissue opacification with mucoperiosteal reaction. Mild lobulated ethmoid mucosal thickening. Skeleton: Status post median sternotomy. Multilevel severe/ moderate to severe degenerative discs  and severe facet arthropathy. Severe LEFT C3-4, severe RIGHT and LEFT C4-5, moderate to severe C5-6 neural foraminal narrowing. Upper chest: Partially imaged centrilobular emphysema. Subcentimeter mediastinal lymph nodes are likely reactive. Other: IMPRESSION: Severe suspected pharyngitis/laryngitis with apposition of the true vocal cords, which may be due to edema or phonation. Given degree of pharyngeal enlargement though unlikely, lymphoproliferative disease is a possibility. Severe chronic  maxillary sinusitis. Severe intracranial atherosclerosis with suspected stenosis RIGHT supraclinoid internal carotid artery. Probable severe LEFT internal carotid artery stenosis. Acute findings discussed with and reconfirmed by Dr.STEPHEN KOHUT on 06/05/2016 at 4:10 pm. Electronically Signed   By: Awilda Metroourtnay  Bloomer M.D.   On: 06/05/2016 16:12   Dg Chest Portable 1 View  Result Date: 06/05/2016 CLINICAL DATA:  Shortness of breath, COPD, hypertension, smoker, prior CABG, intubation EXAM: PORTABLE CHEST 1 VIEW COMPARISON:  Portable exam 1627 hours without priors for comparison FINDINGS: Nasogastric tube extends into stomach. No endotracheal tube identified. Normal heart size post median sternotomy. Mediastinal contours and pulmonary vascularity normal. Atherosclerotic calcification aorta. Bronchitic changes with bibasilar opacities which could represent atelectasis or infiltrate, greater on LEFT. Upper lungs clear. No pleural effusion or pneumothorax. IMPRESSION: Bibasilar atelectasis versus infiltrate LEFT greater than RIGHT. Aortic atherosclerosis. No endotracheal tube identified. Findings discussed with Dr. Juleen ChinaKohut on 06/05/2016 at 1650 hours. Electronically Signed   By: Ulyses SouthwardMark  Boles M.D.   On: 06/05/2016 16:51     Assessment/Plan Principal Problem:   Acute pharyngitis   On mechanically assisted ventilation (HCC) Admit to ICU/inpatient. Continue supplemental oxygen and mechanical ventilation. Follow-up blood gases in a.m. Continue cardiac monitoring. Continue Solu-Medrol 60 mg IVP every 8 hours. I will also order Toradol 15 every 6 hours x3 doses. Vancomycin and aztreonam per pharmacy. Dr. Juanetta GoslingHawkins from pulmonary/critical care will evaluate in a.m.  Active Problems:   Hypomagnesemia Replacement with magnesium sulfate 8 g IV ordered.    Hypokalemia Replacing. Follow-up potassium level in a.m.      COPD (chronic obstructive pulmonary disease) (HCC) Continue mechanical ventilation. Continue  bronchodilators. On Solu-Medrol for pharynx/larynx inflammation.    Carotid artery stenosis Will check carotid Doppler in a.m. Given the severity of this and recent dyspnea will also check EKG and echocardiogram.    Tobacco use disorder  please offer nicotine replacement therapy when awake and extubated.     DVT prophylaxis: Lovenox SQ. Code Status: Full code. Family Communication:  Disposition Plan:  Consults called: Dr. Kari BaarsEdward Hawkins (Pulmonology).  Admission status: Inpatient/ICU.   Bobette Moavid Manuel Ortiz MD [facility] Pager [phone].  If 7PM-7AM, please contact night-coverage [email or web address] Password [facility] [email or web address]  06/05/2016, 7:14 PM

## 2016-06-05 NOTE — Progress Notes (Signed)
Ventilator switched out with new machine, exp. Cassette bad on other machine.

## 2016-06-05 NOTE — ED Notes (Signed)
Patient transported to CT 

## 2016-06-05 NOTE — ED Notes (Addendum)
Ketamine 60mg  administered IV by Desma PaganiniEllen F., RN. Dose and medication verified with EDP and x2 RN. CRNA has bag valve mask applied. Suction x2 set up. Attempting with glidescope at this time.

## 2016-06-05 NOTE — ED Notes (Addendum)
Per Dr. Wilson Singer, pt to be intubated due to CT results.   RT,CRNA/OR staff, primary RN, Dr. Rosana Hoes aware and at bedside.   Suction,airway cart, RSI kit at bedside as well

## 2016-06-05 NOTE — Progress Notes (Signed)
Pharmacy Antibiotic Note  Clarence Bradshaw is a 70 y.o. male admitted on 06/05/2016 with severe pharyngitis.  Pharmacy has been consulted for VANCOMYCIN dosing.  Plan: Vancomycin 1000mg  IV x 1 then 500mg  IV q12hrs Monitor labs, progress, c/s  Weight: 130 lb (59 kg)  Temp (24hrs), Avg:97.6 F (36.4 C), Min:97.6 F (36.4 C), Max:97.6 F (36.4 C)   Recent Labs Lab 06/05/16 1352  WBC 29.2*  CREATININE 1.01    CrCl cannot be calculated (Unknown ideal weight.).    Allergies  Allergen Reactions  . Demerol [Meperidine]     nausea  . Penicillins     Anaphylaxis    Antimicrobials this admission: Vancomycin 12/12 >>  Rocephin 12/12 >>   Dose adjustments this admission:  Microbiology results:  BCx: pending  UCx: pending   Sputum:    MRSA PCR:   Thank you for allowing pharmacy to be a part of this patient's care.  Valrie HartHall, Lajarvis Italiano A 06/05/2016 8:25 PM

## 2016-06-05 NOTE — Progress Notes (Signed)
Pt intubated by ER doctor.  ET tube secured with holder at 22@lip .  Equal bilateral breath sounds, positive CO2 color change.  Pt placed on vent.  Tolerating well at this time. Rt will continue to monitor.

## 2016-06-05 NOTE — ED Notes (Addendum)
EDP Unable to see cords with glidescope and MAC 3. Ambu bag being administered by CRNA. Minimal chest rise and fall noted. EDP attempting to visualize cords at this time. CRNA suctioning intermittently.

## 2016-06-06 ENCOUNTER — Ambulatory Visit (HOSPITAL_COMMUNITY): Payer: Medicare Other

## 2016-06-06 ENCOUNTER — Inpatient Hospital Stay (HOSPITAL_COMMUNITY): Payer: Medicare Other

## 2016-06-06 DIAGNOSIS — J42 Unspecified chronic bronchitis: Secondary | ICD-10-CM

## 2016-06-06 DIAGNOSIS — J029 Acute pharyngitis, unspecified: Secondary | ICD-10-CM

## 2016-06-06 DIAGNOSIS — J9601 Acute respiratory failure with hypoxia: Secondary | ICD-10-CM

## 2016-06-06 DIAGNOSIS — G9340 Encephalopathy, unspecified: Secondary | ICD-10-CM

## 2016-06-06 DIAGNOSIS — R0600 Dyspnea, unspecified: Secondary | ICD-10-CM

## 2016-06-06 LAB — CBC WITH DIFFERENTIAL/PLATELET
Basophils Absolute: 0 K/uL (ref 0.0–0.1)
Basophils Relative: 0 %
Eosinophils Absolute: 0 K/uL (ref 0.0–0.7)
Eosinophils Relative: 0 %
HCT: 32.1 % — ABNORMAL LOW (ref 39.0–52.0)
Hemoglobin: 11.2 g/dL — ABNORMAL LOW (ref 13.0–17.0)
Lymphocytes Relative: 8 %
Lymphs Abs: 1.3 K/uL (ref 0.7–4.0)
MCH: 30 pg (ref 26.0–34.0)
MCHC: 34.9 g/dL (ref 30.0–36.0)
MCV: 86.1 fL (ref 78.0–100.0)
Monocytes Absolute: 0.3 K/uL (ref 0.1–1.0)
Monocytes Relative: 2 %
Neutro Abs: 14.7 K/uL — ABNORMAL HIGH (ref 1.7–7.7)
Neutrophils Relative %: 90 %
Platelets: 380 K/uL (ref 150–400)
RBC: 3.73 MIL/uL — ABNORMAL LOW (ref 4.22–5.81)
RDW: 14 % (ref 11.5–15.5)
WBC: 16.3 K/uL — ABNORMAL HIGH (ref 4.0–10.5)

## 2016-06-06 LAB — BLOOD GAS, ARTERIAL
Acid-base deficit: 2.3 mmol/L — ABNORMAL HIGH (ref 0.0–2.0)
Acid-base deficit: 1 mmol/L (ref 0.0–2.0)
Allens test (pass/fail): POSITIVE — AB
Bicarbonate: 22.6 mmol/L (ref 20.0–28.0)
Bicarbonate: 23.8 mmol/L (ref 20.0–28.0)
Drawn by: 382351
Drawn by: 44898
FIO2: 30
FIO2: 30
MECHVT: 400 mL
O2 Saturation: 96.7 %
O2 Saturation: 92.3 %
PATIENT TEMPERATURE: 98.6
PCO2 ART: 43.7 mmHg (ref 32.0–48.0)
PEEP: 5 cmH2O
PEEP: 5 cmH2O
PO2 ART: 75.6 mmHg — AB (ref 83.0–108.0)
Patient temperature: 37
Pressure control: 12 cmH2O
RATE: 26 {breaths}/min
RATE: 14 resp/min
pCO2 arterial: 36.4 mmHg (ref 32.0–48.0)
pH, Arterial: 7.395 (ref 7.350–7.450)
pH, Arterial: 7.354 (ref 7.350–7.450)
pO2, Arterial: 105 mmHg (ref 83.0–108.0)

## 2016-06-06 LAB — RESPIRATORY PANEL BY PCR
Adenovirus: NOT DETECTED
BORDETELLA PERTUSSIS-RVPCR: NOT DETECTED
CORONAVIRUS 229E-RVPPCR: NOT DETECTED
CORONAVIRUS NL63-RVPPCR: NOT DETECTED
CORONAVIRUS OC43-RVPPCR: NOT DETECTED
Chlamydophila pneumoniae: NOT DETECTED
Coronavirus HKU1: NOT DETECTED
Influenza A: NOT DETECTED
Influenza B: NOT DETECTED
METAPNEUMOVIRUS-RVPPCR: NOT DETECTED
Mycoplasma pneumoniae: NOT DETECTED
PARAINFLUENZA VIRUS 1-RVPPCR: NOT DETECTED
PARAINFLUENZA VIRUS 2-RVPPCR: NOT DETECTED
PARAINFLUENZA VIRUS 3-RVPPCR: NOT DETECTED
Parainfluenza Virus 4: NOT DETECTED
RHINOVIRUS / ENTEROVIRUS - RVPPCR: DETECTED — AB
Respiratory Syncytial Virus: NOT DETECTED

## 2016-06-06 LAB — PHOSPHORUS: Phosphorus: 4.3 mg/dL (ref 2.5–4.6)

## 2016-06-06 LAB — GLUCOSE, CAPILLARY
GLUCOSE-CAPILLARY: 122 mg/dL — AB (ref 65–99)
Glucose-Capillary: 152 mg/dL — ABNORMAL HIGH (ref 65–99)

## 2016-06-06 LAB — COMPREHENSIVE METABOLIC PANEL
ALT: 16 U/L — ABNORMAL LOW (ref 17–63)
ANION GAP: 9 (ref 5–15)
AST: 19 U/L (ref 15–41)
Albumin: 2.7 g/dL — ABNORMAL LOW (ref 3.5–5.0)
Alkaline Phosphatase: 39 U/L (ref 38–126)
BILIRUBIN TOTAL: 0.4 mg/dL (ref 0.3–1.2)
BUN: 28 mg/dL — ABNORMAL HIGH (ref 6–20)
CO2: 22 mmol/L (ref 22–32)
Calcium: 6.2 mg/dL — CL (ref 8.9–10.3)
Chloride: 105 mmol/L (ref 101–111)
Creatinine, Ser: 0.94 mg/dL (ref 0.61–1.24)
GFR calc non Af Amer: >60 mL/min (ref >60)
GLUCOSE: 135 mg/dL — AB (ref 65–99)
POTASSIUM: 3.8 mmol/L (ref 3.5–5.1)
SODIUM: 136 mmol/L (ref 135–145)
TOTAL PROTEIN: 5.6 g/dL — AB (ref 6.5–8.1)

## 2016-06-06 LAB — ECHOCARDIOGRAM COMPLETE
Height: 69 in
Weight: 2077.62 [oz_av]

## 2016-06-06 LAB — MAGNESIUM
Magnesium: 3.2 mg/dL — ABNORMAL HIGH (ref 1.7–2.4)
Magnesium: 3 mg/dL — ABNORMAL HIGH (ref 1.7–2.4)

## 2016-06-06 LAB — MRSA PCR SCREENING: MRSA by PCR: NEGATIVE

## 2016-06-06 LAB — PROCALCITONIN

## 2016-06-06 MED ORDER — FENTANYL CITRATE (PF) 100 MCG/2ML IJ SOLN
200.0000 ug | Freq: Once | INTRAMUSCULAR | Status: AC
  Administered 2016-06-06: 200 ug via INTRAVENOUS

## 2016-06-06 MED ORDER — FENTANYL CITRATE (PF) 100 MCG/2ML IJ SOLN
50.0000 ug | INTRAMUSCULAR | Status: DC | PRN
  Administered 2016-06-07: 50 ug via INTRAVENOUS
  Filled 2016-06-06: qty 2

## 2016-06-06 MED ORDER — MIDAZOLAM HCL 2 MG/2ML IJ SOLN: 1.0000 mg | INTRAMUSCULAR | Status: DC | PRN

## 2016-06-06 MED ORDER — SODIUM CHLORIDE 0.9 % IV SOLN
2.0000 g | Freq: Once | INTRAVENOUS | Status: AC
  Administered 2016-06-06: 2 g via INTRAVENOUS
  Filled 2016-06-06: qty 20

## 2016-06-06 MED ORDER — MIDAZOLAM 50MG/50ML (1MG/ML) PREMIX INFUSION
INTRAVENOUS | Status: AC
  Filled 2016-06-06: qty 50

## 2016-06-06 MED ORDER — VITAL AF 1.2 CAL PO LIQD
1000.0000 mL | ORAL | Status: DC
  Administered 2016-06-06: 1000 mL
  Filled 2016-06-06 (×2): qty 1000

## 2016-06-06 MED ORDER — CHLORHEXIDINE GLUCONATE 0.12% ORAL RINSE (MEDLINE KIT)
15.0000 mL | Freq: Two times a day (BID) | OROMUCOSAL | Status: DC
  Administered 2016-06-06: 15 mL via OROMUCOSAL

## 2016-06-06 MED ORDER — FENTANYL CITRATE (PF) 100 MCG/2ML IJ SOLN
INTRAMUSCULAR | Status: AC
  Filled 2016-06-06: qty 4

## 2016-06-06 MED ORDER — ORAL CARE MOUTH RINSE
15.0000 mL | Freq: Four times a day (QID) | OROMUCOSAL | Status: DC
  Administered 2016-06-06 (×2): 15 mL via OROMUCOSAL

## 2016-06-06 MED ORDER — ROCURONIUM BROMIDE 50 MG/5ML IV SOLN
50.0000 mg | Freq: Once | INTRAVENOUS | Status: AC
  Administered 2016-06-06: 50 mg via INTRAVENOUS

## 2016-06-06 MED ORDER — ETOMIDATE 2 MG/ML IV SOLN
20.0000 mg | Freq: Once | INTRAVENOUS | Status: AC
  Administered 2016-06-06: 20 mg via INTRAVENOUS

## 2016-06-06 MED ORDER — ORAL CARE MOUTH RINSE
15.0000 mL | OROMUCOSAL | Status: DC
  Administered 2016-06-06 – 2016-06-07 (×8): 15 mL via OROMUCOSAL

## 2016-06-06 MED ORDER — FENTANYL CITRATE (PF) 100 MCG/2ML IJ SOLN: 50.0000 ug | INTRAMUSCULAR | Status: DC | PRN

## 2016-06-06 MED ORDER — CHLORHEXIDINE GLUCONATE 0.12% ORAL RINSE (MEDLINE KIT)
15.0000 mL | Freq: Two times a day (BID) | OROMUCOSAL | Status: DC
  Administered 2016-06-06 – 2016-06-07 (×3): 15 mL via OROMUCOSAL

## 2016-06-06 NOTE — Progress Notes (Signed)
Initial Nutrition Assessment  DOCUMENTATION CODES:   Non-severe (moderate) malnutrition in context of chronic illness  INTERVENTION:   Start Vital AF 1.2 @ 20 ml/hr, monitor tolerance and advance as tolerated  At goal: Vital AF 1.2 @ 50 ml/hr (1200 ml/day) Provides: 1440 kcal, 90 grams protein, and 973 ml H2O.   NUTRITION DIAGNOSIS:   Malnutrition (Moderate) related to chronic illness (COPD) as evidenced by mild depletion of body fat, mild depletion of muscle mass.  GOAL:   Patient will meet greater than or equal to 90% of their needs  MONITOR:   TF tolerance, Vent status, Labs  REASON FOR ASSESSMENT:   Consult Enteral/tube feeding initiation and management  ASSESSMENT:   70 y.o. male with medical history significant of COPD, hypertension was coming to the emergency department due to progressively worse dyspnea, wheezing, sore throat, abdominal pain, nausea and emesis for several days.  Patient is currently intubated on ventilator support MV: 7.3 L/min Temp (24hrs), Avg:97.7 F (36.5 C), Min:97.4 F (36.3 C), Max:98.3 F (36.8 C)  Medications reviewed and include: solumedrol Nutrition-Focused physical exam completed. Findings are mild/moderate fat depletion, mild/moderate muscle depletion, and no edema.     Diet Order:  Diet NPO time specified  Skin:  Reviewed, no issues  Last BM:  unknown  Height:   Ht Readings from Last 1 Encounters:  06/06/16 5\' 9"  (1.753 m)    Weight:   Wt Readings from Last 1 Encounters:  06/06/16 129 lb 13.6 oz (58.9 kg)    Ideal Body Weight:  72.7 kg  BMI:  Body mass index is 19.18 kg/m.  Estimated Nutritional Needs:   Kcal:  1446  Protein:  90-110 grams  Fluid:  > 1.5 L/day  EDUCATION NEEDS:   No education needs identified at this time  Kendell BaneHeather Santiel Topper RD, LDN, CNSC [phone] Pager [phone] After Hours Pager

## 2016-06-06 NOTE — Progress Notes (Signed)
Pharmacy Antibiotic Note  Clarence Bradshaw is a 70 y.o. male admitted on 06/05/2016 with severe pharyngitis.  [facility]  Plan: Continue Vancomycin 500mg  IV q12hrs Continue Aztreonam 1gm IV q8hrs Check trough level at steady state Monitor labs, progress, c/s  Height: 5\' 9"  (175.3 cm) Weight: 129 lb 13.6 oz (58.9 kg) IBW/kg (Calculated) : 70.7  Temp (24hrs), Avg:97.5 F (36.4 C), Min:97.4 F (36.3 C), Max:97.7 F (36.5 C)   Recent Labs Lab 06/05/16 1352 06/06/16 0416 06/06/16 0500  WBC 29.2* 16.3*  --   CREATININE 1.01  --  0.94    Estimated Creatinine Clearance: 60 mL/min (by C-G formula based on SCr of 0.94 mg/dL).    Allergies  Allergen Reactions  . Demerol [Meperidine]     nausea  . Penicillins     Anaphylaxis    Antimicrobials this admission: Vancomycin 12/12 >>  Rocephin and Clindamycin 12/12 >> 12/12 Aztreonam 12/12 >>  Dose adjustments this admission:  Microbiology results:  BCx: pending  UCx: pending   Sputum:    MRSA PCR:   Thank you for allowing pharmacy to be a part of this patient's care.  Valrie HartHall, Ahnna Dungan A 06/06/2016 9:08 AM

## 2016-06-06 NOTE — Progress Notes (Signed)
PROGRESS NOTE                                                                                                                                                                                                             Patient Demographics:    Clarence Bradshaw, is a 70 y.o. male, DOB - 1945/10/30, RZN:[id number]  Admit date - 06/05/2016   Admitting Physician Reubin Milan, MD  Outpatient Primary MD for the patient is [facility]  LOS - 1  Chief Complaint  Patient presents with  . Oral Swelling    sob       Brief Narrative  Clarence Bradshaw is a 70 y.o. male with medical history significant of COPD, hypertension was coming to the emergency department due to progressively worse dyspnea, wheezing, sore throat, abdominal pain, nausea and emesis for several days.  According to the patient's friend, Henderson Newcomer, who was present in the room at the time of my examination, the patient was started on a Levaquin course about 9 days ago for sore throat which has not worked. He has been vomiting multiple times a day, but today he has numerous times of emesis. No documented fever or chills, but positive fatigue.   At [facility] ER workup showed severe leukocytosis, severe pharyngeal edema with clinical evidence of unstable airway, he was intubated in the ER with the assistance of a glidocope   Subjective:    Clarence Bradshaw today Is intubated and sedated, unable to answer questions or follow commands   Assessment  & Plan :     1.Unstable airway due to severe pharyngeal edema/infection causing acute hypoxic respiratory failure requiring endotracheal intubation in the ER with size 6 ET tube. He is currently on empiric IV antibiotics and IV steroids, he continues to have an unstable airway, he was seen by pulmonary physician Dr. Luan Pulling who requested that patient be transferred to [facility], I called  pulmonary physician at [facility] Dr. Nelda Marseille who graciously accepted the patient in transfer. We also contacted ENT physician locally in [place] but we were told that inpatient consults are not provided in this hospital.  He should discuss currently on ventilator, ABGs this morning are stable, he is on 30% FiO2 with 400 mL tidal volume, continue supportive care and transferred to [facility].   2.  History of COPD, smoking. For now supportive care, will be counseled to quit smoking once he is extubated.  3. Carotid artery stenosis. Carotid duplex was ordered but pending.  4. Hypokalemia and hypomagnesemia. Have replaced. Monitor.      Family Communication  :  None  Code Status :  Full  Diet : Diet NPO time specified   Disposition Plan  :  [facility]  Consults  :  Pulm  Procedures  :     DVT Prophylaxis  :  Lovenox   Lab Results  Component Value Date   PLT 380 06/06/2016    Inpatient Medications  Scheduled Meds: . aztreonam  1 g Intravenous Q8H  . calcium gluconate  2 g Intravenous Once  . chlorhexidine gluconate (MEDLINE KIT)  15 mL Mouth Rinse BID  . enoxaparin (LOVENOX) injection  40 mg Subcutaneous Q24H  . famotidine  20 mg Intravenous Q12H  . ipratropium-albuterol  3 mL Nebulization Q6H  . ketorolac  15 mg Intravenous Q6H  . mouth rinse  15 mL Mouth Rinse QID  . methylPREDNISolone sodium succinate  60 mg Intravenous Q8H  . vancomycin  500 mg Intravenous Q12H   Continuous Infusions: . 0.9 % NaCl with KCl 40 mEq / L 125 mL/hr (06/06/16 0523)  . midazolam (VERSED) infusion 6 mg/hr (06/06/16 0834)  . propofol (DIPRIVAN) infusion Stopped (06/06/16 0118)   PRN Meds:.albuterol, midazolam, ondansetron **OR** ondansetron (ZOFRAN) IV  Antibiotics  :    Anti-infectives    Start     Dose/Rate Route Frequency Ordered Stop   06/06/16 1000  vancomycin (VANCOCIN) 500 mg in sodium chloride 0.9 % 100 mL IVPB     500 mg 100 mL/hr over 60 Minutes Intravenous Every 12 hours  06/05/16 2025     06/05/16 2200  aztreonam (AZACTAM) 1 g in dextrose 5 % 50 mL IVPB     1 g 100 mL/hr over 30 Minutes Intravenous Every 8 hours 06/05/16 2108     06/05/16 2030  cefTRIAXone (ROCEPHIN) 1 g in dextrose 5 % 50 mL IVPB  Status:  Discontinued     1 g 100 mL/hr over 30 Minutes Intravenous Every 24 hours 06/05/16 2020 06/05/16 2042   06/05/16 2030  vancomycin (VANCOCIN) IVPB 1000 mg/200 mL premix     1,000 mg 200 mL/hr over 60 Minutes Intravenous  Once 06/05/16 2023 06/05/16 2314   06/05/16 1445  clindamycin (CLEOCIN) IVPB 600 mg     600 mg 100 mL/hr over 30 Minutes Intravenous  Once 06/05/16 1439 06/05/16 1600         Objective:   Vitals:   06/06/16 0600 06/06/16 0700 06/06/16 0745 06/06/16 0806  BP: (!) 105/54 (!) 110/53    Pulse: (!) 59 (!) 59  64  Resp: (!) 26 (!) 26  (!) 23  Temp:    97.5 F (36.4 C)  TempSrc:    Axillary  SpO2: 99% 100% 100% 100%  Weight:      Height:        Wt Readings from Last 3 Encounters:  06/06/16 58.9 kg (129 lb 13.6 oz)     Intake/Output Summary (Last 24 hours) at 06/06/16 0928 Last data filed at 06/06/16 0700  Gross per 24 hour  Intake               50 ml  Output             1150 ml  Net            -  1100 ml     Physical Exam  Intubated and sedated, moves all 4 extremities to painful stimuli, in no apparent F.N deficits, Normal affect [place].AT,PERRAL Supple Neck,No JVD, No cervical lymphadenopathy appriciated.  Symmetrical Chest wall movement, Good air movement bilaterally, CTAB RRR,No Gallops,Rubs or new Murmurs, No Parasternal Heave +ve B.Sounds, Abd Soft, No tenderness, No organomegaly appriciated, No rebound - guarding or rigidity. No Cyanosis, Clubbing or edema, No new Rash or bruise       Data Review:    CBC  Recent Labs Lab 06/05/16 1352 06/06/16 0416  WBC 29.2* 16.3*  HGB 12.3* 11.2*  HCT 37.1* 32.1*  PLT 520* 380  MCV 86.3 86.1  MCH 28.6 30.0  MCHC 33.2 34.9  RDW 13.5 14.0  LYMPHSABS 5.0* 1.3    MONOABS 2.3* 0.3  EOSABS 0.0 0.0  BASOSABS 0.0 0.0    Chemistries   Recent Labs Lab 06/05/16 1352 06/05/16 1749 06/06/16 0416 06/06/16 0500  NA 135  --   --  136  K 2.9*  --   --  3.8  CL 94*  --   --  105  CO2 29  --   --  22  GLUCOSE 90  --   --  135*  BUN 25*  --   --  28*  CREATININE 1.01  --   --  0.94  CALCIUM 7.0*  --   --  6.2*  MG  --  0.5* 3.2*  --   AST  --   --   --  19  ALT  --   --   --  16*  ALKPHOS  --   --   --  39  BILITOT  --   --   --  0.4   ------------------------------------------------------------------------------------------------------------------ No results for input(s): CHOL, HDL, LDLCALC, TRIG, CHOLHDL, LDLDIRECT in the last 72 hours.  No results found for: HGBA1C ------------------------------------------------------------------------------------------------------------------ No results for input(s): TSH, T4TOTAL, T3FREE, THYROIDAB in the last 72 hours.  Invalid input(s): FREET3 ------------------------------------------------------------------------------------------------------------------ No results for input(s): VITAMINB12, FOLATE, FERRITIN, TIBC, IRON, RETICCTPCT in the last 72 hours.  Coagulation profile No results for input(s): INR, PROTIME in the last 168 hours.  No results for input(s): DDIMER in the last 72 hours.  Cardiac Enzymes No results for input(s): CKMB, TROPONINI, MYOGLOBIN in the last 168 hours.  Invalid input(s): CK ------------------------------------------------------------------------------------------------------------------ No results found for: BNP  Micro Results Recent Results (from the past 240 hour(s))  MRSA PCR Screening     Status: None   Collection Time: 06/05/16  8:21 PM  Result Value Ref Range Status   MRSA by PCR NEGATIVE NEGATIVE Final    Comment:        The GeneXpert MRSA Assay (FDA approved for NASAL specimens only), is one component of a comprehensive MRSA colonization surveillance  program. It is not intended to diagnose MRSA infection nor to guide or monitor treatment for MRSA infections.     Radiology Reports Ct Soft Tissue Neck W Contrast  Result Date: 06/05/2016 CLINICAL DATA:  Shortness of breath, globus sensation for 3 days. Neck pain and stridor. History of COPD. EXAM: CT NECK WITH CONTRAST TECHNIQUE: Multidetector CT imaging of the neck was performed using the standard protocol following the bolus administration of intravenous contrast. CONTRAST:  67m ISOVUE-300 IOPAMIDOL (ISOVUE-300) INJECTION 61% COMPARISON:  None. FINDINGS: Pharynx and larynx: Severely enlarged adenoidal soft tissues, with fullness of Waldeyer's ring. Secretions in the hypopharynx. Mildly edematous epiglottis, apposition of the true vocal cords and false  vocal cords. No radiopaque foreign bodies or focal fluid collection. Salivary glands: Symmetrically diminutive parotid glands with prominent accessory parotid tissue. No acute process. Thyroid: Normal. Lymph nodes: Vascular: Status post RIGHT carotid endarterectomy. Calcific atherosclerosis results and suspected severe stenosis LEFT internal carotid artery origin. Severe calcific atherosclerosis of the aortic arch. Limited intracranial: Severe calcific atherosclerosis of the carotid siphons with suspected stenosis RIGHT supraclinoid internal carotid artery. Old small LEFT cerebellar infarct. Visualized orbits: Old LEFT medial orbital blowout fracture. Mastoids and visualized paranasal sinuses: Severe bilateral maxillary sinus soft tissue opacification with mucoperiosteal reaction. Mild lobulated ethmoid mucosal thickening. Skeleton: Status post median sternotomy. Multilevel severe/ moderate to severe degenerative discs and severe facet arthropathy. Severe LEFT C3-4, severe RIGHT and LEFT C4-5, moderate to severe C5-6 neural foraminal narrowing. Upper chest: Partially imaged centrilobular emphysema. Subcentimeter mediastinal lymph nodes are likely  reactive. Other: IMPRESSION: Severe suspected pharyngitis/laryngitis with apposition of the true vocal cords, which may be due to edema or phonation. Given degree of pharyngeal enlargement though unlikely, lymphoproliferative disease is a possibility. Severe chronic maxillary sinusitis. Severe intracranial atherosclerosis with suspected stenosis RIGHT supraclinoid internal carotid artery. Probable severe LEFT internal carotid artery stenosis. Acute findings discussed with and reconfirmed by Dr.STEPHEN KOHUT on 06/05/2016 at 4:10 pm. Electronically Signed   By: Elon Alas M.D.   On: 06/05/2016 16:12   Dg Chest Port 1 View  Result Date: 06/06/2016 CLINICAL DATA:  Intubation. EXAM: PORTABLE CHEST 1 VIEW COMPARISON:  06/05/2016. FINDINGS: Endotracheal tube tip not well identified, and may be in the cervical trachea. NG tube noted with tip below left hemidiaphragm. Bibasilar atelectasis. Heart size normal. Prior CABG. No pleural effusion or pneumothorax. IMPRESSION: 1. Endotracheal tube tip not well identified and may be in the cervical trachea. NG tube noted with tip below left hemidiaphragm. 2. Bibasilar atelectasis. Critical Value/emergent results were called by telephone at the time of interpretation on 06/06/2016 at 7:33 am to nurse Denyse Amass, who verbally acknowledged these results an was aware of these findings. Electronically Signed   By: Marcello Moores  Register   On: 06/06/2016 07:34   Dg Chest Portable 1 View  Result Date: 06/05/2016 CLINICAL DATA:  Shortness of breath, COPD, hypertension, smoker, prior CABG, intubation EXAM: PORTABLE CHEST 1 VIEW COMPARISON:  Portable exam 1627 hours without priors for comparison FINDINGS: Nasogastric tube extends into stomach. No endotracheal tube identified. Normal heart size post median sternotomy. Mediastinal contours and pulmonary vascularity normal. Atherosclerotic calcification aorta. Bronchitic changes with bibasilar opacities which could represent atelectasis or  infiltrate, greater on LEFT. Upper lungs clear. No pleural effusion or pneumothorax. IMPRESSION: Bibasilar atelectasis versus infiltrate LEFT greater than RIGHT. Aortic atherosclerosis. No endotracheal tube identified. Findings discussed with Dr. Wilson Singer on 06/05/2016 at 1650 hours. Electronically Signed   By: Lavonia Dana M.D.   On: 06/05/2016 16:51    Time Spent in minutes  30   Shantavia Jha K M.D on 06/06/2016 at 9:28 AM  Between 7am to 7pm - Pager - [phone]  After 7pm go to [email or web address] - password [facility]  [facility] -  Office  [phone]

## 2016-06-06 NOTE — Progress Notes (Signed)
Patient starting to cough around tube when suctioned. Tube is still in place and still past the vocal cords. Patient's edema could be going down causing the leak. RN notified. Santiago BurBridget Bernd Crom, RRT, RCP.

## 2016-06-06 NOTE — Progress Notes (Signed)
Report called to Dois DavenportSandra, RN on 4N. [facility] here at this time to pick up patient.

## 2016-06-06 NOTE — Progress Notes (Signed)
Dr. Thedore MinsSingh notified of Calcium level 6.2.

## 2016-06-06 NOTE — Consult Note (Addendum)
PULMONARY / CRITICAL CARE MEDICINE   Name: Clarence Bradshaw MRN: [id number] DOB: 06/14/1946    ADMISSION DATE:  06/05/2016 CONSULTATION DATE:  06/06/2016  REFERRING MD:  Dr. Thedore MinsSingh   CHIEF COMPLAINT:  Pharyngeal Edema   HISTORY OF PRESENT ILLNESS:   70 year old male with PMH of COPD and HTN presented to [facility] ED on 12/12 with several days of progressively worsening dyspnea, wheezing, sore throat, abdominal pain, nausea and emesis. 9 days ago was placed on a 9 day course of Levaquin after presenting to PCP for sore throat, which has not give any relief. On 12/10 [facility], where he received a nebulizer treatement and was checked for strep throat which was negative. Upon arrival to ED on 12/13 patient had severe pharyngeal edema with unstable airway in which he was intubated by ER physician.      PAST MEDICAL HISTORY :  He  has a past medical history of COPD (chronic obstructive pulmonary disease) (HCC) and Hypertension.  PAST SURGICAL HISTORY: He  has a past surgical history that includes Coronary artery bypass graft; Back surgery; Cardiac surgery; and Percutaneous placement intravascular stent cervical carotid artery.  Allergies  Allergen Reactions  . Demerol [Meperidine]     nausea  . Penicillins     Anaphylaxis     No current facility-administered medications on file prior to encounter.    No current outpatient prescriptions on file prior to encounter.    FAMILY HISTORY:  His has no family status information on file.    SOCIAL HISTORY: He  reports that he has been smoking Cigarettes.  He has been smoking about 1.00 pack per day. He has quit using smokeless tobacco. He reports that he does not drink alcohol.  REVIEW OF SYSTEMS:   Unable to obtain, patient is intubated and sedated.   SUBJECTIVE:  Arrives to ICU, intubated and sedated with 6mm ETT.   VITAL SIGNS: BP 136/61   Pulse 75   Temp 98.3 F (36.8 C) (Oral)   Resp (!) 23   Ht 5\' 9"   (1.753 m)   Wt 58.9 kg (129 lb 13.6 oz)   SpO2 99%   BMI 19.18 kg/m   HEMODYNAMICS:    VENTILATOR SETTINGS: Vent Mode: PRVC FiO2 (%):  [30 %-100 %] 30 % Set Rate:  [26 bmp] 26 bmp Vt Set:  [400 mL] 400 mL PEEP:  [5 cmH20] 5 cmH20 Plateau Pressure:  [15 cmH20-17 cmH20] 16 cmH20  INTAKE / OUTPUT: I/O last 3 completed shifts: In: 6150 [IV Piggyback:50] Out: 1150 [Urine:1150]  PHYSICAL EXAMINATION: General:  Adult male on vent, sedated  Neuro: sedated, moves all extremities to noxious stimuli  HEENT: ETT in place, normocephalic  Cardiovascular:  No MRG, RRR, NI s1/s2 Lungs: on vent, unlabored, mild exp wheezing   Abdomen: non-distended, active bowel sounds  Musculoskeletal: no deformities  Skin: warm, dry, intact    LABS:  BMET  Recent Labs Lab 06/05/16 1352 06/06/16 0500  NA 135 136  K 2.9* 3.8  CL 94* 105  CO2 29 22  BUN 25* 28*  CREATININE 1.01 0.94  GLUCOSE 90 135*    Electrolytes  Recent Labs Lab 06/05/16 1352 06/05/16 1749 06/06/16 0416 06/06/16 0500  CALCIUM 7.0*  --   --  6.2*  MG  --  0.5* 3.2*  --   PHOS  --  4.0  --   --     CBC  Recent Labs Lab 06/05/16 1352 06/06/16 0416  WBC 29.2* 16.3*  HGB 12.3* 11.2*  HCT 37.1* 32.1*  PLT 520* 380    Coag's No results for input(s): APTT, INR in the last 168 hours.  Sepsis Markers No results for input(s): LATICACIDVEN, PROCALCITON, O2SATVEN in the last 168 hours.  ABG  Recent Labs Lab 06/05/16 1645 06/06/16 0500  PHART 7.457* 7.395  PCO2ART 39.4 36.4  PO2ART 120* 105    Liver Enzymes  Recent Labs Lab 06/06/16 0500  AST 19  ALT 16*  ALKPHOS 39  BILITOT 0.4  ALBUMIN 2.7*    Cardiac Enzymes No results for input(s): TROPONINI, PROBNP in the last 168 hours.  Glucose No results for input(s): GLUCAP in the last 168 hours.  Imaging Ct Soft Tissue Neck W Contrast  Result Date: 06/05/2016 CLINICAL DATA:  Shortness of breath, globus sensation for 3 days. Neck pain and  stridor. History of COPD. EXAM: CT NECK WITH CONTRAST TECHNIQUE: Multidetector CT imaging of the neck was performed using the standard protocol following the bolus administration of intravenous contrast. CONTRAST:  75mL ISOVUE-300 IOPAMIDOL (ISOVUE-300) INJECTION 61% COMPARISON:  None. FINDINGS: Pharynx and larynx: Severely enlarged adenoidal soft tissues, with fullness of Waldeyer's ring. Secretions in the hypopharynx. Mildly edematous epiglottis, apposition of the true vocal cords and false vocal cords. No radiopaque foreign bodies or focal fluid collection. Salivary glands: Symmetrically diminutive parotid glands with prominent accessory parotid tissue. No acute process. Thyroid: Normal. Lymph nodes: Vascular: Status post RIGHT carotid endarterectomy. Calcific atherosclerosis results and suspected severe stenosis LEFT internal carotid artery origin. Severe calcific atherosclerosis of the aortic arch. Limited intracranial: Severe calcific atherosclerosis of the carotid siphons with suspected stenosis RIGHT supraclinoid internal carotid artery. Old small LEFT cerebellar infarct. Visualized orbits: Old LEFT medial orbital blowout fracture. Mastoids and visualized paranasal sinuses: Severe bilateral maxillary sinus soft tissue opacification with mucoperiosteal reaction. Mild lobulated ethmoid mucosal thickening. Skeleton: Status post median sternotomy. Multilevel severe/ moderate to severe degenerative discs and severe facet arthropathy. Severe LEFT C3-4, severe RIGHT and LEFT C4-5, moderate to severe C5-6 neural foraminal narrowing. Upper chest: Partially imaged centrilobular emphysema. Subcentimeter mediastinal lymph nodes are likely reactive. Other: IMPRESSION: Severe suspected pharyngitis/laryngitis with apposition of the true vocal cords, which may be due to edema or phonation. Given degree of pharyngeal enlargement though unlikely, lymphoproliferative disease is a possibility. Severe chronic maxillary  sinusitis. Severe intracranial atherosclerosis with suspected stenosis RIGHT supraclinoid internal carotid artery. Probable severe LEFT internal carotid artery stenosis. Acute findings discussed with and reconfirmed by Dr.STEPHEN KOHUT on 06/05/2016 at 4:10 pm. Electronically Signed   By: Awilda Metroourtnay  Bloomer M.D.   On: 06/05/2016 16:12   Dg Chest Port 1 View  Result Date: 06/06/2016 CLINICAL DATA:  Intubation. EXAM: PORTABLE CHEST 1 VIEW COMPARISON:  06/05/2016. FINDINGS: Endotracheal tube tip not well identified, and may be in the cervical trachea. NG tube noted with tip below left hemidiaphragm. Bibasilar atelectasis. Heart size normal. Prior CABG. No pleural effusion or pneumothorax. IMPRESSION: 1. Endotracheal tube tip not well identified and may be in the cervical trachea. NG tube noted with tip below left hemidiaphragm. 2. Bibasilar atelectasis. Critical Value/emergent results were called by telephone at the time of interpretation on 06/06/2016 at 7:33 am to nurse Verdon CumminsJesse, who verbally acknowledged these results an was aware of these findings. Electronically Signed   By: Maisie Fushomas  Register   On: 06/06/2016 07:34   Dg Chest Portable 1 View  Result Date: 06/05/2016 CLINICAL DATA:  Shortness of breath, COPD, hypertension, smoker, prior CABG, intubation EXAM: PORTABLE CHEST  1 VIEW COMPARISON:  Portable exam 1627 hours without priors for comparison FINDINGS: Nasogastric tube extends into stomach. No endotracheal tube identified. Normal heart size post median sternotomy. Mediastinal contours and pulmonary vascularity normal. Atherosclerotic calcification aorta. Bronchitic changes with bibasilar opacities which could represent atelectasis or infiltrate, greater on LEFT. Upper lungs clear. No pleural effusion or pneumothorax. IMPRESSION: Bibasilar atelectasis versus infiltrate LEFT greater than RIGHT. Aortic atherosclerosis. No endotracheal tube identified. Findings discussed with Dr. Juleen China on 06/05/2016 at 1650  hours. Electronically Signed   By: Ulyses Southward M.D.   On: 06/05/2016 16:51     STUDIES:  12/12 CT Neck > Severe suspected pharyngitis/laryngitis  CULTURES: Sputum 12/13 > Urine 12/13 > Blood 12/13 >  ANTIBIOTICS: Vancomycin 12/12 > Azactam 12/12 >   SIGNIFICANT EVENTS: 12/12 > Presents to [facility] ED with sore throat and dyspnea  12/13 > Sent to [place] > ETT tube exchange   LINES/TUBES: ETT (6.0) 12/12 >12/13 ETT (8.0) 12/13 >>   DISCUSSION: 70 year old male arrived to [facility] ED on 12/12 with sore throat and dyspnea. He was noted to have severe pharyngeal edema. Was emergently intubated by EDP. Treated with antibiotics and steroids. Was sent to [facility] on 12/13. Tube exchange on arrival to unit. Will culture, continue antibiotics, and IV steroids.   ASSESSMENT / PLAN:  PULMONARY A: Acute Respiratory Failure secondary to severe pharyngeal edema  H/O COPD  P:   Vent Support  Wean oxygen to maintain saturation >92 Exchange tube for 8 Continue Solu-medrol 60 mg q8h CXR now     CARDIOVASCULAR A:  H/O HTN  P:  Cardiac Monitoring  Keep MAP >65  RENAL A:   No issues  P:   Trend BMP Replace Electrolytes as needed   GASTROINTESTINAL A:   Dysphagia  P:   Speech/Swallow evaluation after extubation   HEMATOLOGIC A:   No issues  P:  Trend CBC   INFECTIOUS A:   Leucocytosis  Pharyngitis P:   Send RVP  PAN Culture  Trend Procal  Continue Vancomycin  Continue Azactam   ENDOCRINE A:   No issues  P:   Trend glucose   NEUROLOGIC A:   Drug induced Encephalopathy  P:   RASS goal: 0/-1 Wean Versed gtt PRN Fentanyl and Versed     FAMILY  - Updates: No family at bedside 12/13  - Inter-disciplinary family meet or Palliative Care meeting due by: 12/20   Jovita Kussmaul, AG-ACNP [facility]  Pgr: [phone]  PCCM Pgr: [phone]  Attending Note:  70 year old male with PMH of COPD who went [facility] his PCP and [facility] for a sore throat where he was given levaquin and sent home.  Patient [facility] with sore throat, noted to have severe edema and patient was intubated with a 6 tube and was transferred to [facility] for "specialized care".  On exam, patient is sedated and intubated, moving all ext to noxious stimuli and lungs with mild end exp wheezing.  I reviewed CXR myself, ETT.  Will place on aztreonam and vanc.  Continue steroids as ordered.  Change ETT to an 8.  Change vent mode to PCV for synchrony.  Repeat CXR and ABG now.  Reculture and will f/u.  The patient is critically ill with multiple organ systems failure and requires high complexity decision making for assessment and support, frequent evaluation and titration of therapies, application of advanced monitoring technologies and extensive interpretation of multiple databases.  Critical Care Time devoted to patient care services described in this note is  35  Minutes. This time reflects time of care of this signee Dr Koren Bound. This critical care time does not reflect procedure time, or teaching time or supervisory time of PA/NP/Med student/Med Resident etc but could involve care discussion time.  Alyson Reedy, M.D. [facility] Pulmonary/Critical Care Medicine. Pager: [phone]. After hours pager: [phone].

## 2016-06-06 NOTE — Procedures (Signed)
OGT Insertion By MD  OGT inserted by MD under direct laryngoscopy.  Alyson ReedyWesam G. Zarin Knupp, M.D. [facility]monary/Critical Care Medicine. Pager: [phone]. After hours pager: [phone].

## 2016-06-06 NOTE — Consult Note (Signed)
Consult requested by: [facility] Consult requested for respiratory failure:  HPI: This is a 70 year old who has been having cough congestion shortness of breath and sore throat for several days. He has been on Levaquin and he's been nauseated from that. At baseline he has COPD. He went to the hospital at [facility] for similar symptoms he was given a breathing treatment checked for strep which was reportedly negative and discharged. He continues to have difficulty. He came to the emergency department and eventually was intubated for airway protection. He was noted to have very swollen tonsils and during intubation the posterior pharynx arytenoids and airway were all edematous. It was difficult to see the cords but endotracheal tube appears to be in place. This is a #6 cuffed endotracheal tube. He remains on the ventilator. History per the medical record as he is intubated and sedated and no family is present.  Past Medical History:  Diagnosis Date  . COPD (chronic obstructive pulmonary disease) ([place])   . Hypertension      History reviewed. No pertinent family history.   Social History   Social History  . Marital status: Widowed    Spouse name: N/A  . Number of children: N/A  . Years of education: N/A   Social History Main Topics  . Smoking status: Current Every Day Smoker    Packs/day: 1.00    Types: Cigarettes  . Smokeless tobacco: Former Systems developer  . Alcohol use No  . Drug use: Unknown  . Sexual activity: Not Asked   Other Topics Concern  . None   Social History Narrative  . None     ROS: Not obtainable    Objective: Vital signs in last 24 hours: Temp:  [97.4 F (36.3 C)-97.7 F (36.5 C)] 97.4 F (36.3 C) (12/13 0400) Pulse Rate:  [50-98] 59 (12/13 0700) Resp:  [15-26] 26 (12/13 0700) BP: (76-149)/(50-95) 110/53 (12/13 0700) SpO2:  [92 %-100 %] 100 % (12/13 0745) FiO2 (%):  [30 %-100 %] 30 % (12/13 0745) Weight:  [58.9 kg (129 lb 13.6 oz)-59 kg (130 lb)] 58.9  kg (129 lb 13.6 oz) (12/13 0500) Weight change:  Last BM Date:  (UTA,PTA)  Intake/Output from previous day: 12/12 0701 - 12/13 0700 In: 50 [IV Piggyback:50] Out: 1150 [Urine:1150]  PHYSICAL EXAM Constitutional: He is intubated and sedated. Endotracheal tube in oral gastric tube in place. Eyes: Pupils react ears nose mouth and throat I can tell much about his airway now. He still has swollen tonsils. Cardiovascular: His heart is regular with normal heart sounds. Respiratory: His respiratory effort is per the ventilator and he is clear as far as his lungs are concerned. Gastrointestinal: His abdomen is soft skin: Warm and dry neurological he was moving all 4 extremities but he sedated now.  Lab Results: Basic Metabolic Panel:  Recent Labs  06/05/16 1352 06/05/16 1749 06/06/16 0416 06/06/16 0500  NA 135  --   --  136  K 2.9*  --   --  3.8  CL 94*  --   --  105  CO2 29  --   --  22  GLUCOSE 90  --   --  135*  BUN 25*  --   --  28*  CREATININE 1.01  --   --  0.94  CALCIUM 7.0*  --   --  6.2*  MG  --  0.5* 3.2*  --   PHOS  --  4.0  --   --    Liver Function  Tests:  Recent Labs  06/06/16 0500  AST 19  ALT 16*  ALKPHOS 39  BILITOT 0.4  PROT 5.6*  ALBUMIN 2.7*   No results for input(s): LIPASE, AMYLASE in the last 72 hours. No results for input(s): AMMONIA in the last 72 hours. CBC:  Recent Labs  06/05/16 1352 06/06/16 0416  WBC 29.2* 16.3*  NEUTROABS 21.9* 14.7*  HGB 12.3* 11.2*  HCT 37.1* 32.1*  MCV 86.3 86.1  PLT 520* 380   Cardiac Enzymes: No results for input(s): CKTOTAL, CKMB, CKMBINDEX, TROPONINI in the last 72 hours. BNP: No results for input(s): PROBNP in the last 72 hours. D-Dimer: No results for input(s): DDIMER in the last 72 hours. CBG: No results for input(s): GLUCAP in the last 72 hours. Hemoglobin A1C: No results for input(s): HGBA1C in the last 72 hours. Fasting Lipid Panel: No results for input(s): CHOL, HDL, LDLCALC, TRIG, CHOLHDL,  LDLDIRECT in the last 72 hours. Thyroid Function Tests: No results for input(s): TSH, T4TOTAL, FREET4, T3FREE, THYROIDAB in the last 72 hours. Anemia Panel: No results for input(s): VITAMINB12, FOLATE, FERRITIN, TIBC, IRON, RETICCTPCT in the last 72 hours. Coagulation: No results for input(s): LABPROT, INR in the last 72 hours. Urine Drug Screen: Drugs of Abuse  No results found for: LABOPIA, COCAINSCRNUR, LABBENZ, AMPHETMU, THCU, LABBARB  Alcohol Level: No results for input(s): ETH in the last 72 hours. Urinalysis: No results for input(s): COLORURINE, LABSPEC, PHURINE, GLUCOSEU, HGBUR, BILIRUBINUR, KETONESUR, PROTEINUR, UROBILINOGEN, NITRITE, LEUKOCYTESUR in the last 72 hours.  Invalid input(s): APPERANCEUR Misc. Labs:   ABGS:  Recent Labs  06/06/16 0500  PHART 7.395  PO2ART 105  HCO3 22.6     MICROBIOLOGY: Recent Results (from the past 240 hour(s))  MRSA PCR Screening     Status: None   Collection Time: 06/05/16  8:21 PM  Result Value Ref Range Status   MRSA by PCR NEGATIVE NEGATIVE Final    Comment:        The GeneXpert MRSA Assay (FDA approved for NASAL specimens only), is one component of a comprehensive MRSA colonization surveillance program. It is not intended to diagnose MRSA infection nor to guide or monitor treatment for MRSA infections.     Studies/Results: Ct Soft Tissue Neck W Contrast  Result Date: 06/05/2016 CLINICAL DATA:  Shortness of breath, globus sensation for 3 days. Neck pain and stridor. History of COPD. EXAM: CT NECK WITH CONTRAST TECHNIQUE: Multidetector CT imaging of the neck was performed using the standard protocol following the bolus administration of intravenous contrast. CONTRAST:  64m ISOVUE-300 IOPAMIDOL (ISOVUE-300) INJECTION 61% COMPARISON:  None. FINDINGS: Pharynx and larynx: Severely enlarged adenoidal soft tissues, with fullness of Waldeyer's ring. Secretions in the hypopharynx. Mildly edematous epiglottis, apposition of the  true vocal cords and false vocal cords. No radiopaque foreign bodies or focal fluid collection. Salivary glands: Symmetrically diminutive parotid glands with prominent accessory parotid tissue. No acute process. Thyroid: Normal. Lymph nodes: Vascular: Status post RIGHT carotid endarterectomy. Calcific atherosclerosis results and suspected severe stenosis LEFT internal carotid artery origin. Severe calcific atherosclerosis of the aortic arch. Limited intracranial: Severe calcific atherosclerosis of the carotid siphons with suspected stenosis RIGHT supraclinoid internal carotid artery. Old small LEFT cerebellar infarct. Visualized orbits: Old LEFT medial orbital blowout fracture. Mastoids and visualized paranasal sinuses: Severe bilateral maxillary sinus soft tissue opacification with mucoperiosteal reaction. Mild lobulated ethmoid mucosal thickening. Skeleton: Status post median sternotomy. Multilevel severe/ moderate to severe degenerative discs and severe facet arthropathy. Severe LEFT C3-4, severe RIGHT and LEFT  C4-5, moderate to severe C5-6 neural foraminal narrowing. Upper chest: Partially imaged centrilobular emphysema. Subcentimeter mediastinal lymph nodes are likely reactive. Other: IMPRESSION: Severe suspected pharyngitis/laryngitis with apposition of the true vocal cords, which may be due to edema or phonation. Given degree of pharyngeal enlargement though unlikely, lymphoproliferative disease is a possibility. Severe chronic maxillary sinusitis. Severe intracranial atherosclerosis with suspected stenosis RIGHT supraclinoid internal carotid artery. Probable severe LEFT internal carotid artery stenosis. Acute findings discussed with and reconfirmed by Dr.STEPHEN KOHUT on 06/05/2016 at 4:10 pm. Electronically Signed   By: Elon Alas M.D.   On: 06/05/2016 16:12   Dg Chest Port 1 View  Result Date: 06/06/2016 CLINICAL DATA:  Intubation. EXAM: PORTABLE CHEST 1 VIEW COMPARISON:  06/05/2016. FINDINGS:  Endotracheal tube tip not well identified, and may be in the cervical trachea. NG tube noted with tip below left hemidiaphragm. Bibasilar atelectasis. Heart size normal. Prior CABG. No pleural effusion or pneumothorax. IMPRESSION: 1. Endotracheal tube tip not well identified and may be in the cervical trachea. NG tube noted with tip below left hemidiaphragm. 2. Bibasilar atelectasis. Critical Value/emergent results were called by telephone at the time of interpretation on 06/06/2016 at 7:33 am to nurse Denyse Amass, who verbally acknowledged these results an was aware of these findings. Electronically Signed   By: Marcello Moores  Register   On: 06/06/2016 07:34   Dg Chest Portable 1 View  Result Date: 06/05/2016 CLINICAL DATA:  Shortness of breath, COPD, hypertension, smoker, prior CABG, intubation EXAM: PORTABLE CHEST 1 VIEW COMPARISON:  Portable exam 1627 hours without priors for comparison FINDINGS: Nasogastric tube extends into stomach. No endotracheal tube identified. Normal heart size post median sternotomy. Mediastinal contours and pulmonary vascularity normal. Atherosclerotic calcification aorta. Bronchitic changes with bibasilar opacities which could represent atelectasis or infiltrate, greater on LEFT. Upper lungs clear. No pleural effusion or pneumothorax. IMPRESSION: Bibasilar atelectasis versus infiltrate LEFT greater than RIGHT. Aortic atherosclerosis. No endotracheal tube identified. Findings discussed with Dr. Wilson Singer on 06/05/2016 at 1650 hours. Electronically Signed   By: Lavonia Dana M.D.   On: 06/05/2016 16:51    Medications:  Prior to Admission:  No prescriptions prior to admission.   Scheduled: . aztreonam  1 g Intravenous Q8H  . calcium gluconate  2 g Intravenous Once  . chlorhexidine gluconate (MEDLINE KIT)  15 mL Mouth Rinse BID  . enoxaparin (LOVENOX) injection  40 mg Subcutaneous Q24H  . famotidine  20 mg Intravenous Q12H  . ipratropium-albuterol  3 mL Nebulization Q6H  . ketorolac  15 mg  Intravenous Q6H  . mouth rinse  15 mL Mouth Rinse QID  . methylPREDNISolone sodium succinate  60 mg Intravenous Q8H  . vancomycin  500 mg Intravenous Q12H   Continuous: . 0.9 % NaCl with KCl 40 mEq / L 125 mL/hr (06/06/16 0523)  . midazolam (VERSED) infusion 6 mg/hr (06/06/16 0257)  . propofol (DIPRIVAN) infusion Stopped (06/06/16 0118)   [facility]:QPRFFMBWG, midazolam, ondansetron **OR** ondansetron (ZOFRAN) IV  Assesment:He was admitted with acute pharyngitis and significant airway swelling. He is intubated and sedated. This is a very small endotracheal tube. He has COPD at baseline. Principal Problem:   Acute pharyngitis Active Problems:   Hypomagnesemia   Hypokalemia   On mechanically assisted ventilation (H[facility])   COPD (chronic obstructive pulmonary disease) (H[facility])   Tobacco use disorder   Carotid artery stenosis    Plan: Discussed with Dr. Candiss Norse hospitalist attending. since we do not have a ENT coverage here I'm concerned about difficulty with airway management particularly  if his endotracheal tube were to be accidentally dislodged. I think he is on appropriate treatment and probably improving but I think it is safer for him to be transferred for higher level of care if possible    LOS: 1 day   Ceola Para L 06/06/2016, 7:59 AM

## 2016-06-06 NOTE — Procedures (Signed)
Intubation Procedure Note Omar PersonRichard Eastham [id number] 01/28/1946  Procedure: Intubation Indications: Airway protection and maintenance  Procedure Details Consent: Unable to obtain consent because of emergent medical necessity. Time Out: Verified patient identification, verified procedure, site/side was marked, verified correct patient position, special equipment/implants available, medications/allergies/relevent history reviewed, required imaging and test results available.  Performed  Maximum sterile technique was used including gloves, gown, hand hygiene and mask.  MAC    Evaluation Hemodynamic Status: BP stable throughout; O2 sats: stable throughout Patient's Current Condition: stable Complications: No apparent complications Patient did tolerate procedure well. Chest X-ray ordered to verify placement.  CXR: pending.   Koren BoundYACOUB,Pleas Carneal 06/06/2016

## 2016-06-06 NOTE — Progress Notes (Signed)
DR TEO, ENT, CALLED FOR CONSULT. HE DOES NOT DO INPATIENTS AT [facility]. PT'S HX AND DX EXPLAINED TO HIM, AND HE ENCOURAGED IV ANTIBIOTICS AND STEROIDS. [facility] NOTIF[facility].

## 2016-06-07 ENCOUNTER — Inpatient Hospital Stay (HOSPITAL_COMMUNITY): Payer: Medicare Other

## 2016-06-07 DIAGNOSIS — A419 Sepsis, unspecified organism: Secondary | ICD-10-CM

## 2016-06-07 LAB — CBC WITH DIFFERENTIAL/PLATELET
Basophils Absolute: 0 10*3/uL (ref 0.0–0.1)
Basophils Relative: 0 %
EOS ABS: 0 10*3/uL (ref 0.0–0.7)
Eosinophils Relative: 0 %
HEMATOCRIT: 32.8 % — AB (ref 39.0–52.0)
Hemoglobin: 11.1 g/dL — ABNORMAL LOW (ref 13.0–17.0)
LYMPHS ABS: 0.8 10*3/uL (ref 0.7–4.0)
LYMPHS PCT: 3 %
MCH: 28.8 pg (ref 26.0–34.0)
MCHC: 33.8 g/dL (ref 30.0–36.0)
MCV: 85.2 fL (ref 78.0–100.0)
MONOS PCT: 3 %
Monocytes Absolute: 0.8 10*3/uL (ref 0.1–1.0)
NEUTROS ABS: 25.6 10*3/uL — AB (ref 1.7–7.7)
Neutrophils Relative %: 94 %
Platelets: 380 10*3/uL (ref 150–400)
RBC: 3.85 MIL/uL — AB (ref 4.22–5.81)
RDW: 14.2 % (ref 11.5–15.5)
WBC: 27.2 10*3/uL — ABNORMAL HIGH (ref 4.0–10.5)

## 2016-06-07 LAB — URINE CULTURE: CULTURE: NO GROWTH

## 2016-06-07 LAB — COMPREHENSIVE METABOLIC PANEL
ALK PHOS: 38 U/L (ref 38–126)
ALT: 16 U/L — AB (ref 17–63)
AST: 20 U/L (ref 15–41)
Albumin: 2.5 g/dL — ABNORMAL LOW (ref 3.5–5.0)
Anion gap: 5 (ref 5–15)
BILIRUBIN TOTAL: 0.3 mg/dL (ref 0.3–1.2)
BUN: 28 mg/dL — ABNORMAL HIGH (ref 6–20)
CALCIUM: 6.8 mg/dL — AB (ref 8.9–10.3)
CHLORIDE: 107 mmol/L (ref 101–111)
CO2: 26 mmol/L (ref 22–32)
CREATININE: 0.83 mg/dL (ref 0.61–1.24)
Glucose, Bld: 145 mg/dL — ABNORMAL HIGH (ref 65–99)
Potassium: 4.2 mmol/L (ref 3.5–5.1)
Sodium: 138 mmol/L (ref 135–145)
TOTAL PROTEIN: 5.6 g/dL — AB (ref 6.5–8.1)

## 2016-06-07 LAB — TRIGLYCERIDES: TRIGLYCERIDES: 206 mg/dL — AB (ref <150)

## 2016-06-07 LAB — GLUCOSE, CAPILLARY
GLUCOSE-CAPILLARY: 132 mg/dL — AB (ref 65–99)
GLUCOSE-CAPILLARY: 136 mg/dL — AB (ref 65–99)
GLUCOSE-CAPILLARY: 145 mg/dL — AB (ref 65–99)
Glucose-Capillary: 147 mg/dL — ABNORMAL HIGH (ref 65–99)

## 2016-06-07 LAB — PHOSPHORUS: Phosphorus: 3.1 mg/dL (ref 2.5–4.6)

## 2016-06-07 LAB — PROCALCITONIN

## 2016-06-07 LAB — MAGNESIUM: MAGNESIUM: 2.5 mg/dL — AB (ref 1.7–2.4)

## 2016-06-07 MED ORDER — CHLORHEXIDINE GLUCONATE 0.12 % MT SOLN
15.0000 mL | Freq: Two times a day (BID) | OROMUCOSAL | Status: DC
  Administered 2016-06-07 – 2016-06-10 (×6): 15 mL via OROMUCOSAL
  Filled 2016-06-07 (×4): qty 15

## 2016-06-07 MED ORDER — ORAL CARE MOUTH RINSE: 15.0000 mL | Freq: Two times a day (BID) | OROMUCOSAL | Status: DC

## 2016-06-07 MED ORDER — METHYLPREDNISOLONE SODIUM SUCC 125 MG IJ SOLR
40.0000 mg | Freq: Two times a day (BID) | INTRAMUSCULAR | Status: DC
  Administered 2016-06-07: 40 mg via INTRAVENOUS
  Filled 2016-06-07: qty 2

## 2016-06-07 MED ORDER — ENSURE ENLIVE PO LIQD
237.0000 mL | Freq: Two times a day (BID) | ORAL | Status: DC
  Administered 2016-06-07 – 2016-06-10 (×3): 237 mL via ORAL

## 2016-06-07 NOTE — Progress Notes (Signed)
Nutrition Follow-up  DOCUMENTATION CODES:   Non-severe (moderate) malnutrition in context of chronic illness  INTERVENTION:   Ensure Enlive po BID, each supplement provides 350 kcal and 20 grams of protein  Magic cup TID with meals, each supplement provides 290 kcal and 9 grams of protein   NUTRITION DIAGNOSIS:   Malnutrition (Moderate) related to chronic illness (COPD) as evidenced by mild depletion of body fat, mild depletion of muscle mass. Ongoing.   GOAL:   Patient will meet greater than or equal to 90% of their needs Progressing.   MONITOR:   TF tolerance, Vent status, Labs  ASSESSMENT:   70 y.o. male with medical history significant of COPD, hypertension was coming to the emergency department due to progressively worse dyspnea, wheezing, sore throat, abdominal pain, nausea and emesis for several days.  12/14 extubated this am, diet advanced Labs reviewed: TG 206   Diet Order:  DIET DYS 3 Room service appropriate? Yes; Fluid consistency: Thin  Skin:  Reviewed, no issues  Last BM:  unknown  Height:   Ht Readings from Last 1 Encounters:  06/06/16 5\' 9"  (1.753 m)    Weight:   Wt Readings from Last 1 Encounters:  06/07/16 145 lb 15.1 oz (66.2 kg)    Ideal Body Weight:  72.7 kg  BMI:  Body mass index is 21.55 kg/m.  Estimated Nutritional Needs:   Kcal:  1700-1900  Protein:  85-100 grams  Fluid:  > 1.7 L/day  EDUCATION NEEDS:   No education needs identified at this time  Kendell BaneHeather Shellsea Borunda RD, LDN, CNSC [phone] Pager [phone] After Hours Pager

## 2016-06-07 NOTE — Progress Notes (Signed)
PULMONARY / CRITICAL CARE MEDICINE   Name: Clarence Bradshaw MRN: [id number] DOB: April 09, 1946    ADMISSION DATE:  06/05/2016 CONSULTATION DATE:  06/06/2016  REFERRING MD:  Dr. Thedore Mins   CHIEF COMPLAINT:  Pharyngeal Edema   HISTORY OF PRESENT ILLNESS:   70 year old male with PMH of COPD and HTN presented to [facility] on 12/12 with several days of progressively worsening dyspnea, wheezing, sore throat, abdominal pain, nausea and emesis. 9 days ago was placed on a 9 day course of Levaquin after presenting to PCP for sore throat, which has not give any relief. On 12/10 patient went to [facility] ED with dyspnea, where he received a nebulizer treatement and was checked for strep throat which was negative. Upon arrival to ED on 12/13 patient had severe pharyngeal edema with unstable airway in which he was intubated by ER physician.    SUBJECTIVE:  No events overnight, much more alert and interactive  VITAL SIGNS: BP (!) 119/51   Pulse 62   Temp 98.3 F (36.8 C) (Oral)   Resp 12   Ht 5\' 9"  (1.753 m)   Wt 66.2 kg (145 lb 15.1 oz)   SpO2 100%   BMI 21.55 kg/m   HEMODYNAMICS:    VENTILATOR SETTINGS: Vent Mode: PSV;CPAP FiO2 (%):  [30 %] 30 % Set Rate:  [14 bmp] 14 bmp PEEP:  [5 cmH20] 5 cmH20 Pressure Support:  [5 cmH20] 5 cmH20 Plateau Pressure:  [9 cmH20-15 cmH20] 9 cmH20  INTAKE / OUTPUT: I/O last 3 completed shifts: In: 1698.9 [I.V.:701.2; NG/GT:297.7; IV Piggyback:700] Out: 1925 [Urine:1925]  PHYSICAL EXAMINATION: General:  Adult male on vent, arousable and following commands Neuro: Moves all extremities to command  HEENT: ETT in place, [place]/AT, PERRL, EOM-I Cardiovascular:  No MRG, RRR, NI s1/s2 Lungs: on vent, weaning and CTA bilaterally, cuff leak present   Abdomen: non-distended, active bowel sounds  Musculoskeletal: no deformities  Skin: warm, dry, intact    LABS:  BMET  Recent Labs Lab 06/05/16 1352 06/06/16 0500 06/07/16 0442  NA 135 136 138  K 2.9* 3.8 4.2  CL  94* 105 107  CO2 29 22 26   BUN 25* 28* 28*  CREATININE 1.01 0.94 0.83  GLUCOSE 90 135* 145*   Electrolytes  Recent Labs Lab 06/05/16 1352  06/05/16 1749 06/06/16 0416 06/06/16 0500 06/06/16 1448 06/07/16 0442  CALCIUM 7.0*  --   --   --  6.2*  --  6.8*  MG  --   < > 0.5* 3.2*  --  3.0* 2.5*  PHOS  --   --  4.0  --   --  4.3 3.1  < > = values in this interval not displayed.  CBC  Recent Labs Lab 06/05/16 1352 06/06/16 0416 06/07/16 0442  WBC 29.2* 16.3* 27.2*  HGB 12.3* 11.2* 11.1*  HCT 37.1* 32.1* 32.8*  PLT 520* 380 380   Coag's No results for input(s): APTT, INR in the last 168 hours.  Sepsis Markers  Recent Labs Lab 06/06/16 1358 06/07/16 0442  PROCALCITON <0.10 <0.10   ABG  Recent Labs Lab 06/05/16 1645 06/06/16 0500 06/06/16 1515  PHART 7.457* 7.395 7.354  PCO2ART 39.4 36.4 43.7  PO2ART 120* 105 75.6*   Liver Enzymes  Recent Labs Lab 06/06/16 0500 06/07/16 0442  AST 19 20  ALT 16* 16*  ALKPHOS 39 38  BILITOT 0.4 0.3  ALBUMIN 2.7* 2.5*   Cardiac Enzymes No results for input(s): TROPONINI, PROBNP in the last 168 hours.  Glucose  Recent Labs Lab 06/06/16 1609 06/06/16 1936 06/07/16 0029 06/07/16 0318 06/07/16 0816  GLUCAP 122* 152* 136* 145* 147*   Imaging [place] Carotid Bilateral  Result Date: 06/06/2016 CLINICAL DATA:  70 year old male with a history of carotid disease. Cardiovascular risk factors include hypertension, known stroke/ TIA, known coronary disease, known carotid disease with prior vascular surgery/ carotid endarterectomy, tobacco exposure EXAM: BILATERAL CAROTID DUPLEX ULTRASOUND TECHNIQUE: [name] scale imaging, color Doppler and duplex ultrasound were performed of bilateral carotid and vertebral arteries in the neck. COMPARISON:  CT 06/05/2016 FINDINGS: Criteria: Quantification of carotid stenosis is based on velocity parameters that correlate the residual internal carotid diameter with NASCET-based stenosis levels,  using the diameter of the distal internal carotid lumen as the denominator for stenosis measurement. The following velocity measurements were obtained: RIGHT ICA:  Systolic 63 cm/sec, Diastolic 19 cm/sec CCA:  93 cm/sec SYSTOLIC ICA/CCA RATIO:  0.7 ECA:  118 cm/sec LEFT ICA:  Systolic 278 cm/sec, Diastolic 23 cm/sec CCA:  113 cm/sec SYSTOLIC ICA/CCA RATIO:  2.5 ECA:  95 cm/sec Right Brachial SBP: Not acquired Left Brachial SBP: Not acquired RIGHT CAROTID ARTERY: Given history of prior right carotid endarterectomy. Atherosclerotic changes of the right carotid system. Mild calcified plaque of the common carotid artery. Intermediate waveform maintained. Mild wall thickening at the right carotid bulb measures greater than 1 mm, though does not appear to result in any narrowing. Low resistance waveform of the right ICA. Mild tortuosity. RIGHT VERTEBRAL ARTERY: Antegrade flow with low resistance waveform. LEFT CAROTID ARTERY: Atherosclerotic changes with calcified plaque of the common carotid artery. Intermediate waveform maintained. Heterogeneous and calcified plaque at the carotid bifurcation. Luminal shadowing is present. High resistance systolic upstroke of the ICA waveform with reduced diastolic flow. LEFT VERTEBRAL ARTERY:  Antegrade flow with low resistance waveform. IMPRESSION: Right: Postsurgical history of right carotid endarterectomy. Note that the application of duplex velocity criteria has not been validated in the postoperative patient, however, there is evidence of minimal intimal hyperplasia without evidence of significant recurrent stenosis. Left: Heterogeneous and densely calcified plaque at the carotid bifurcation, with discordant results regarding degree of stenosis by established duplex criteria. Peak velocity suggests 70% - 99% stenosis, with the ICA/ CCA ratio suggesting a lesser degree of stenosis. Note that the flow velocities of the left ICA were obtained from an area distal to the maximum  narrowing due to the presence of anterior wall plaque with shadowing and may be underestimating the percentage of ICA stenosis. Also, the ICA waveform is suggestive of intracranial stenosis, in addition to the cervical stenosis. If a more precise assessment of left cervical ICA and intracranial ICA stenosis is warranted, a formal cerebral angiogram may be considered. Signed, Yvone NeuJaime S. Loreta AveWagner, DO [facility] Electronically Signed   By: Gilmer MorJaime  Wagner D.O.   On: 06/06/2016 14:08   Dg Chest Port 1 View  Result Date: 06/07/2016 CLINICAL DATA:  Ventilator support.  Followup. EXAM: PORTABLE CHEST 1 VIEW COMPARISON:  06/06/2016 FINDINGS: Previous median sternotomy. Endotracheal tube tip 4.5 cm above the carina. Nasogastric tube enters the stomach. Mild interstitial pulmonary prominence persists which could be chronic or mild interstitial edema. Volume loss in the lower lobes left worse than right persist which could be atelectasis or atelectatic pneumonia. Upper lungs are clear. No effusions. IMPRESSION: No change. Interstitial prominence which could be chronic scarring or mild interstitial edema. Lower lobe density left worsened right that could be atelectasis or mild pneumonia. Electronically Signed  By: Paulina Fusi M.D.   On: 06/07/2016 07:13   Dg Chest Port 1 View  Result Date: 06/06/2016 CLINICAL DATA:  Endotracheal tube exchange.  The OG tube placement. EXAM: PORTABLE CHEST 1 VIEW COMPARISON:  06/06/2016 FINDINGS: There is an endotracheal tube with the tip 2 cm above the carina. There is a nasogastric tube coursing below the diaphragm. There is mild bilateral interstitial thickening. There is no focal parenchymal opacity. There is no pleural effusion or pneumothorax. The heart and mediastinal contours are unremarkable. There is evidence of prior CABG. The osseous structures are unremarkable. IMPRESSION: Endotracheal tube with the tip 2 cm above the  carina. Nasogastric tube coursing below the diaphragm. Electronically Signed   By: Elige Ko   On: 06/06/2016 13:46     STUDIES:  12/12 CT Neck > Severe suspected pharyngitis/laryngitis  CULTURES: Sputum 12/13 > Urine 12/13 > Blood 12/13 >  ANTIBIOTICS: Vancomycin 12/12 > Azactam 12/12 >   SIGNIFICANT EVENTS: 12/12 > Presents to [facility] ED with sore throat and dyspnea  12/13 > Sent to [place] > ETT tube exchange   LINES/TUBES: ETT (6.0) 12/12 >12/13 ETT (8.0) 12/13 >>   DISCUSSION: 70 year old male arrived to [facility] ED on 12/12 with sore throat and dyspnea. He was noted to have severe pharyngeal edema. Was emergently intubated by EDP. Treated with antibiotics and steroids. Was sent to [facility] on 12/13. Tube exchange on arrival to unit. Will culture, continue antibiotics, and IV steroids.   ASSESSMENT / PLAN:  PULMONARY A: Acute Respiratory Failure secondary to severe pharyngeal edema  H/O COPD  P:   Vent Support  Wean oxygen to maintain saturation >92 Exchange tube for 8 Continue Solu-medrol 60 mg q8h CXR now     CARDIOVASCULAR A:  H/O HTN  P:  Cardiac Monitoring  Keep MAP >65  RENAL A:   No issues  P:   Trend BMP Replace Electrolytes as needed   GASTROINTESTINAL A:   Dysphagia  P:   Speech/Swallow evaluation after extubation   HEMATOLOGIC A:   No issues  P:  Trend CBC   INFECTIOUS A:   Leucocytosis  Pharyngitis P:   Send RVP  PAN Culture  Trend Procal  Continue Vancomycin  Continue Azactam   ENDOCRINE A:   No issues  P:   Trend glucose   NEUROLOGIC A:   Drug induced Encephalopathy  P:   RASS goal: 0/-1 Wean Versed gtt PRN Fentanyl and Versed     FAMILY  - Updates: No family at bedside 12/13  - Inter-disciplinary family meet or Palliative Care meeting due by: 12/20  Attending Note:  70 year old male with PMH of COPD who went to his PCP and [facility] for a sore throat where he was given levaquin and sent  home. Patient returns to [facility] with sore throat, noted to have severe edema and patient was intubated with a 6 tube and was transferred to [facility] for "specialized care" that was changed to size 8. On exam, patient is much more alert and weaning, moving all ext to command and lungs CTA with an active cuff leak. I reviewed CXR myself, ETT ok. Will continue on aztreonam and vanc for now, if no growth by AM will d/c abx. Decrease steroids as ordered. F/u on cultures.  Extubate.  Hold in the ICU overnight for observation.  The patient is critically ill with multiple organ systems failure and requires high complexity decision making for assessment and support, frequent  evaluation and titration of therapies, application of advanced monitoring technologies and extensive interpretation of multiple databases.   Critical Care Time devoted to patient care services described in this note is 35 Minutes. This time reflects time of care of this signee Dr Koren BoundWesam Yacoub. This critical care time does not reflect procedure time, or teaching time or supervisory time of PA/NP/Med student/Med Resident etc but could involve care discussion time.  Alyson ReedyWesam G. Yacoub, M.D. [facility]. Pager: [phone]. After hours pager: [phone].

## 2016-06-07 NOTE — Progress Notes (Signed)
Waste in sink 40ml versed with Efraim KaufmannMelissa, Charity fundraiserN.

## 2016-06-07 NOTE — Consult Note (Signed)
PULMONARY / CRITICAL CARE MEDICINE   Name: Clarence Bradshaw MRN: [id number] DOB: Jun 22, 1946    ADMISSION DATE:  06/05/2016 CONSULTATION DATE:  06/06/2016  REFERRING MD:  Dr. Thedore Mins   CHIEF COMPLAINT:  Pharyngeal Edema   Brief:   70 year old male with PMH of COPD and HTN presented to [facility] on 12/12 with several days of progressively worsening dyspnea, wheezing, sore throat, abdominal pain, nausea and emesis. 9 days ago was placed on a 9 day course of Levaquin after presenting to PCP for sore throat, which has not give any relief. On 12/10 patient went to [facility] ED with dyspnea, where he received a nebulizer treatement and was checked for strep throat which was negative. Upon arrival to ED on 12/13 patient had severe pharyngeal edema with unstable airway in which he was intubated by ER physician.     SUBJECTIVE:  No events overnight, this AM extubated, 3L [place].   VITAL SIGNS: BP 140/60   Pulse 77   Temp 98.3 F (36.8 C) (Oral)   Resp 12   Ht 5\' 9"  (1.753 m)   Wt 66.2 kg (145 lb 15.1 oz)   SpO2 100%   BMI 21.55 kg/m   HEMODYNAMICS:    VENTILATOR SETTINGS: Vent Mode: PSV;CPAP FiO2 (%):  [30 %] 30 % Set Rate:  [14 bmp] 14 bmp PEEP:  [5 cmH20] 5 cmH20 Pressure Support:  [5 cmH20] 5 cmH20 Plateau Pressure:  [9 cmH20-15 cmH20] 9 cmH20  INTAKE / OUTPUT: I/O last 3 completed shifts: In: 1698.9 [I.V.:701.2; NG/GT:297.7; IV Piggyback:700] Out: 1925 [Urine:1925]  PHYSICAL EXAMINATION: General:  Adult male, resting in bed  Neuro: alert, oriented, follows commands  HEENT: normocephalic, raspy voice   Cardiovascular:  No MRG, RRR, NI s1/s2 Lungs: unlabored, diminished at bases  Abdomen: non-distended, active bowel sounds  Musculoskeletal: no deformities  Skin: warm, dry, intact    LABS:  BMET  Recent Labs Lab 06/05/16 1352 06/06/16 0500 06/07/16 0442  NA 135 136 138  K 2.9* 3.8 4.2  CL 94* 105 107  CO2 29 22 26   BUN 25* 28* 28*  CREATININE 1.01 0.94 0.83  GLUCOSE  90 135* 145*    Electrolytes  Recent Labs Lab 06/05/16 1352  06/05/16 1749 06/06/16 0416 06/06/16 0500 06/06/16 1448 06/07/16 0442  CALCIUM 7.0*  --   --   --  6.2*  --  6.8*  MG  --   < > 0.5* 3.2*  --  3.0* 2.5*  PHOS  --   --  4.0  --   --  4.3 3.1  < > = values in this interval not displayed.  CBC  Recent Labs Lab 06/05/16 1352 06/06/16 0416 06/07/16 0442  WBC 29.2* 16.3* 27.2*  HGB 12.3* 11.2* 11.1*  HCT 37.1* 32.1* 32.8*  PLT 520* 380 380    Coag's No results for input(s): APTT, INR in the last 168 hours.  Sepsis Markers  Recent Labs Lab 06/06/16 1358 06/07/16 0442  PROCALCITON <0.10 <0.10    ABG  Recent Labs Lab 06/05/16 1645 06/06/16 0500 06/06/16 1515  PHART 7.457* 7.395 7.354  PCO2ART 39.4 36.4 43.7  PO2ART 120* 105 75.6*    Liver Enzymes  Recent Labs Lab 06/06/16 0500 06/07/16 0442  AST 19 20  ALT 16* 16*  ALKPHOS 39 38  BILITOT 0.4 0.3  ALBUMIN 2.7* 2.5*    Cardiac Enzymes No results for input(s): TROPONINI, PROBNP in the last 168 hours.  Glucose  Recent Labs Lab 06/06/16 1609  06/06/16 1936 06/07/16 0029 06/07/16 0318 06/07/16 0816  GLUCAP 122* 152* 136* 145* 147*    Imaging [place] Carotid Bilateral  Result Date: 06/06/2016 CLINICAL DATA:  70 year old male with a history of carotid disease. Cardiovascular risk factors include hypertension, known stroke/ TIA, known coronary disease, known carotid disease with prior vascular surgery/ carotid endarterectomy, tobacco exposure EXAM: BILATERAL CAROTID DUPLEX ULTRASOUND TECHNIQUE: [name] scale imaging, color Doppler and duplex ultrasound were performed of bilateral carotid and vertebral arteries in the neck. COMPARISON:  CT 06/05/2016 FINDINGS: Criteria: Quantification of carotid stenosis is based on velocity parameters that correlate the residual internal carotid diameter with NASCET-based stenosis levels, using the diameter of the distal internal carotid lumen as the denominator  for stenosis measurement. The following velocity measurements were obtained: RIGHT ICA:  Systolic 63 cm/sec, Diastolic 19 cm/sec CCA:  93 cm/sec SYSTOLIC ICA/CCA RATIO:  0.7 ECA:  118 cm/sec LEFT ICA:  Systolic 278 cm/sec, Diastolic 23 cm/sec CCA:  113 cm/sec SYSTOLIC ICA/CCA RATIO:  2.5 ECA:  95 cm/sec Right Brachial SBP: Not acquired Left Brachial SBP: Not acquired RIGHT CAROTID ARTERY: Given history of prior right carotid endarterectomy. Atherosclerotic changes of the right carotid system. Mild calcified plaque of the common carotid artery. Intermediate waveform maintained. Mild wall thickening at the right carotid bulb measures greater than 1 mm, though does not appear to result in any narrowing. Low resistance waveform of the right ICA. Mild tortuosity. RIGHT VERTEBRAL ARTERY: Antegrade flow with low resistance waveform. LEFT CAROTID ARTERY: Atherosclerotic changes with calcified plaque of the common carotid artery. Intermediate waveform maintained. Heterogeneous and calcified plaque at the carotid bifurcation. Luminal shadowing is present. High resistance systolic upstroke of the ICA waveform with reduced diastolic flow. LEFT VERTEBRAL ARTERY:  Antegrade flow with low resistance waveform. IMPRESSION: Right: Postsurgical history of right carotid endarterectomy. Note that the application of duplex velocity criteria has not been validated in the postoperative patient, however, there is evidence of minimal intimal hyperplasia without evidence of significant recurrent stenosis. Left: Heterogeneous and densely calcified plaque at the carotid bifurcation, with discordant results regarding degree of stenosis by established duplex criteria. Peak velocity suggests 70% - 99% stenosis, with the ICA/ CCA ratio suggesting a lesser degree of stenosis. Note that the flow velocities of the left ICA were obtained from an area distal to the maximum narrowing due to the presence of anterior wall plaque with shadowing and may be  underestimating the percentage of ICA stenosis. Also, the ICA waveform is suggestive of intracranial stenosis, in addition to the cervical stenosis. If a more precise assessment of left cervical ICA and intracranial ICA stenosis is warranted, a formal cerebral angiogram may be considered. Signed, Yvone NeuJaime S. Loreta AveWagner, DO [facility] Electronically Signed   By: Gilmer MorJaime  Wagner D.O.   On: 06/06/2016 14:08   Dg Chest Port 1 View  Result Date: 06/07/2016 CLINICAL DATA:  Ventilator support.  Followup. EXAM: PORTABLE CHEST 1 VIEW COMPARISON:  06/06/2016 FINDINGS: Previous median sternotomy. Endotracheal tube tip 4.5 cm above the carina. Nasogastric tube enters the stomach. Mild interstitial pulmonary prominence persists which could be chronic or mild interstitial edema. Volume loss in the lower lobes left worse than right persist which could be atelectasis or atelectatic pneumonia. Upper lungs are clear. No effusions. IMPRESSION: No change. Interstitial prominence which could be chronic scarring or mild interstitial edema. Lower lobe density left worsened right that could be atelectasis or mild pneumonia. Electronically Signed   By: Scherrie BatemanMark  Shogry M.D.  On: 06/07/2016 07:13   Dg Chest Port 1 View  Result Date: 06/06/2016 CLINICAL DATA:  Endotracheal tube exchange.  The OG tube placement. EXAM: PORTABLE CHEST 1 VIEW COMPARISON:  06/06/2016 FINDINGS: There is an endotracheal tube with the tip 2 cm above the carina. There is a nasogastric tube coursing below the diaphragm. There is mild bilateral interstitial thickening. There is no focal parenchymal opacity. There is no pleural effusion or pneumothorax. The heart and mediastinal contours are unremarkable. There is evidence of prior CABG. The osseous structures are unremarkable. IMPRESSION: Endotracheal tube with the tip 2 cm above the carina. Nasogastric tube coursing below the diaphragm. Electronically Signed    By: Elige KoHetal  Patel   On: 06/06/2016 13:46     STUDIES:  12/12 CT Neck > Severe suspected pharyngitis/laryngitis  CULTURES: Sputum 12/13 > Urine 12/13 > Blood 12/13 >  ANTIBIOTICS: Vancomycin 12/12 > Azactam 12/12 >   SIGNIFICANT EVENTS: 12/12 > Presents to [facility] ED with sore throat and dyspnea  12/13 > Sent to  > ETT tube exchange   LINES/TUBES: ETT (6.0) 12/12 >12/13 ETT (8.0) 12/13 >>12/14   DISCUSSION: 70 year old male arrived to [facility] ED on 12/12 with sore throat and dyspnea. He was noted to have severe pharyngeal edema. Was emergently intubated by EDP. Treated with antibiotics and steroids. [facility] Tube exchange on arrival to unit. Will culture, continue antibiotics, and IV steroids.   ASSESSMENT / PLAN:  PULMONARY A: Acute Respiratory Failure secondary to severe pharyngeal edema  H/O COPD  P:   Wean oxygen to maintain saturation >92 Decrease Solu-medrol 40 mg q12h  CARDIOVASCULAR A:  H/O HTN  P:  Cardiac Monitoring  Keep MAP >65  RENAL A:   No issues  P:   Trend BMP Replace Electrolytes as needed   GASTROINTESTINAL A:   Dysphagia  P:   Dys 3 diet   HEMATOLOGIC A:   No issues  P:  Trend CBC   INFECTIOUS A:   Leucocytosis (Current Steroid Regimen)  Pharyngitis +Rhino/Enterovirus  P:    Follow Culture  Continue Vancomycin  Continue Azactam   ENDOCRINE A:   No issues  P:   Trend glucose   NEUROLOGIC A:   Drug induced Encephalopathy  P:   RASS goal: 0/-1 D/C Versed gtt D/C PRN Fentanyl and Versed     FAMILY  - Updates: No family at bedside 12/13  - Inter-disciplinary family meet or Palliative Care meeting due by: 12/20   Jovita KussmaulKatalina Elliot Simoneaux, AG-ACNP [place] [facility]  Pgr: [phone]  PCCM Pgr: [phone] .

## 2016-06-07 NOTE — Procedures (Signed)
Extubation Procedure Note  Patient Details:   Name: Omar PersonRichard Ritchie DOB: 03/15/1946 MRN: [id number]   Airway Documentation:     Evaluation  O2 sats: stable throughout Complications: No apparent complications Patient did tolerate procedure well. Bilateral Breath Sounds: Clear, Diminished   Yes  Patient tolerated wean. Positive for cuff leak. MD ordered to extubate. Patient extubated to a 3 Lpm nasal cannula. No signs of dyspnea or stridor noted. Patient resting comfortably. Will continue to monitor.   Ancil BoozerSmallwood, Jovan Colligan 06/07/2016, 9:25 AM

## 2016-06-08 DIAGNOSIS — F17200 Nicotine dependence, unspecified, uncomplicated: Secondary | ICD-10-CM

## 2016-06-08 LAB — PROCALCITONIN: Procalcitonin: <0.1 ng/mL

## 2016-06-08 LAB — COMPREHENSIVE METABOLIC PANEL
ALK PHOS: 61 U/L (ref 38–126)
ALT: 32 U/L (ref 17–63)
ANION GAP: 5 (ref 5–15)
AST: 44 U/L — ABNORMAL HIGH (ref 15–41)
Albumin: 3 g/dL — ABNORMAL LOW (ref 3.5–5.0)
BILIRUBIN TOTAL: 0.8 mg/dL (ref 0.3–1.2)
BUN: 32 mg/dL — ABNORMAL HIGH (ref 6–20)
CALCIUM: 7.8 mg/dL — AB (ref 8.9–10.3)
CO2: 24 mmol/L (ref 22–32)
Chloride: 104 mmol/L (ref 101–111)
Creatinine, Ser: 0.81 mg/dL (ref 0.61–1.24)
GFR calc non Af Amer: >60 mL/min (ref >60)
Glucose, Bld: 122 mg/dL — ABNORMAL HIGH (ref 65–99)
Potassium: 4.7 mmol/L (ref 3.5–5.1)
Sodium: 133 mmol/L — ABNORMAL LOW (ref 135–145)
TOTAL PROTEIN: 6.5 g/dL (ref 6.5–8.1)

## 2016-06-08 MED ORDER — PANTOPRAZOLE SODIUM 40 MG PO TBEC
40.0000 mg | DELAYED_RELEASE_TABLET | Freq: Two times a day (BID) | ORAL | Status: DC
  Administered 2016-06-08 – 2016-06-10 (×5): 40 mg via ORAL
  Filled 2016-06-08 (×5): qty 1

## 2016-06-08 MED ORDER — AMLODIPINE BESYLATE 10 MG PO TABS
10.0000 mg | ORAL_TABLET | Freq: Every day | ORAL | Status: DC
  Administered 2016-06-08 – 2016-06-10 (×3): 10 mg via ORAL
  Filled 2016-06-08 (×3): qty 1

## 2016-06-08 MED ORDER — CARVEDILOL 25 MG PO TABS
25.0000 mg | ORAL_TABLET | Freq: Two times a day (BID) | ORAL | Status: DC
  Administered 2016-06-08 – 2016-06-10 (×4): 25 mg via ORAL
  Filled 2016-06-08 (×4): qty 1

## 2016-06-08 MED ORDER — CALCIUM CARBONATE ANTACID 500 MG PO CHEW
1.0000 | CHEWABLE_TABLET | Freq: Two times a day (BID) | ORAL | Status: DC | PRN
  Administered 2016-06-08: 200 mg via ORAL
  Filled 2016-06-08: qty 1

## 2016-06-08 MED ORDER — SIMVASTATIN 40 MG PO TABS
40.0000 mg | ORAL_TABLET | Freq: Every day | ORAL | Status: DC
  Administered 2016-06-08: 40 mg via ORAL
  Filled 2016-06-08: qty 1

## 2016-06-08 MED ORDER — METHYLPREDNISOLONE SODIUM SUCC 125 MG IJ SOLR
20.0000 mg | Freq: Two times a day (BID) | INTRAMUSCULAR | Status: DC
  Administered 2016-06-08 – 2016-06-10 (×5): 20 mg via INTRAVENOUS
  Filled 2016-06-08 (×5): qty 2

## 2016-06-08 MED ORDER — ATORVASTATIN CALCIUM 20 MG PO TABS
20.0000 mg | ORAL_TABLET | Freq: Every day | ORAL | Status: DC
  Administered 2016-06-09: 20 mg via ORAL
  Filled 2016-06-08: qty 1

## 2016-06-08 NOTE — Progress Notes (Signed)
PULMONARY / CRITICAL CARE MEDICINE   Name: Clarence PersonRichard Lamorte MRN: [id number] DOB: 12/22/1945    ADMISSION DATE:  06/05/2016 CONSULTATION DATE:  06/06/2016  REFERRING MD:  Dr. Thedore MinsSingh   CHIEF COMPLAINT:  Pharyngeal Edema   HISTORY OF PRESENT ILLNESS:   70 year old male with PMH of COPD and HTN presented to [facility] ED on 12/12 with several days of progressively worsening dyspnea, wheezing, sore throat, abdominal pain, nausea and emesis. 9 days ago was placed on a 9 day course of Levaquin after presenting to PCP for sore throat, which has not give any relief. On 12/10 [facility] with dys[facility], where he received a nebulizer treatement and was checked for strep throat which was negative. Upon arrival to ED on 12/13 patient had severe pharyngeal edema with unstable airway in which he was intubated by ER physician.    SUBJECTIVE:  No events overnight, tolerated extubation well, feels well.  VITAL SIGNS: BP (!) 149/74   Pulse 81   Temp 98.7 F (37.1 C) (Oral)   Resp 16   Ht 5\' 9"  (1.753 m)   Wt 66.2 kg (145 lb 15.1 oz)   SpO2 94%   BMI 21.55 kg/m   HEMODYNAMICS:    VENTILATOR SETTINGS: Vent Mode: PSV;CPAP FiO2 (%):  [30 %] 30 % PEEP:  [5 cmH20] 5 cmH20 Pressure Support:  [5 cmH20] 5 cmH20  INTAKE / OUTPUT: I/O last 3 completed shifts: In: 4387 [P.O.:3120; I.V.:87; NG/GT:280; IV Piggyback:900] Out: 2965 [Urine:2965]  PHYSICAL EXAMINATION: General:  Adult male, in bed, sleeping but easily arousable Neuro: Alert and interactive, moving all ext to command HEENT: /AT, PERRL, EOM-I Cardiovascular:  No MRG, RRR, NI s1/s2 Lungs: CTA bilaterally Abdomen: non-distended, active bowel sounds  Musculoskeletal: no deformities  Skin: warm, dry, intact    LABS:  BMET  Recent Labs Lab 06/06/16 0500 06/07/16 0442 06/08/16 0355  NA 136 138 133*  K 3.8 4.2 4.7  CL 105 107 104  CO2 22 26 24   BUN 28* 28* 32*  CREATININE 0.94 0.83 0.81  GLUCOSE 135* 145* 122*    Electrolytes  Recent Labs Lab 06/05/16 1749 06/06/16 0416 06/06/16 0500 06/06/16 1448 06/07/16 0442 06/08/16 0355  CALCIUM  --   --  6.2*  --  6.8* 7.8*  MG 0.5* 3.2*  --  3.0* 2.5*  --   PHOS 4.0  --   --  4.3 3.1  --     CBC  Recent Labs Lab 06/05/16 1352 06/06/16 0416 06/07/16 0442  WBC 29.2* 16.3* 27.2*  HGB 12.3* 11.2* 11.1*  HCT 37.1* 32.1* 32.8*  PLT 520* 380 380   Coag's No results for input(s): APTT, INR in the last 168 hours.  Sepsis Markers  Recent Labs Lab 06/06/16 1358 06/07/16 0442 06/08/16 0355  PROCALCITON <0.10 <0.10 <0.10   ABG  Recent Labs Lab 06/05/16 1645 06/06/16 0500 06/06/16 1515  PHART 7.457* 7.395 7.354  PCO2ART 39.4 36.4 43.7  PO2ART 120* 105 75.6*   Liver Enzymes  Recent Labs Lab 06/06/16 0500 06/07/16 0442 06/08/16 0355  AST 19 20 44*  ALT 16* 16* 32  ALKPHOS 39 38 61  BILITOT 0.4 0.3 0.8  ALBUMIN 2.7* 2.5* 3.0*   Cardiac Enzymes No results for input(s): TROPONINI, PROBNP in the last 168 hours.  Glucose  Recent Labs Lab 06/06/16 1609 06/06/16 1936 06/07/16 0029 06/07/16 0318 06/07/16 0816 06/07/16 1218  GLUCAP 122* 152* 136* 145* 147* 132*   Imaging I reviewed CXR myself,  not acute disease noted  STUDIES:  12/12 CT Neck > Severe suspected pharyngitis/laryngitis  CULTURES: Sputum 12/13 >GPC and GNR, speciation pending Urine 12/13 >NTD Blood 12/13 >NTD  ANTIBIOTICS: Vancomycin 12/12 > Azactam 12/12 >   SIGNIFICANT EVENTS: 12/12 > Presents to [facility] ED with sore throat and dyspnea  12/13 > Sent to [place] > ETT tube exchange   LINES/TUBES: ETT (6.0) 12/12 >12/13 ETT (8.0) 12/13 >>   DISCUSSION: 70 year old male arrived to [facility] ED on 12/12 with sore throat and dyspnea. He was noted to have severe pharyngeal edema. Was emergently intubated by EDP. Treated with antibiotics and steroids. [facility]. Tube exchange on arrival to unit. Will culture, continue antibiotics, and  IV steroids.   ASSESSMENT / PLAN:  PULMONARY A: Acute Respiratory Failure secondary to severe pharyngeal edema  H/O COPD  P:   Wean oxygen to maintain saturation >92 Decrease Solu-medrol 20 mg q12h  CARDIOVASCULAR A:  H/O HTN  P:  Cardiac Monitoring  Keep MAP >65  RENAL A:   No issues  P:   Trend BMP Replace Electrolytes as needed  KVO IVF  GASTROINTESTINAL A:   Dysphagia  P:   Diet as ordered  HEMATOLOGIC A:   No issues  P:  Trend CBC   INFECTIOUS A:   Leucocytosis  Pharyngitis P:   RVP negative PAN Culture final results pending Procal negative but WBC remains very elevated, continue broad spectrum abx until respiratory cultures are final Continue Vancomycin  Continue Azactam   ENDOCRINE A:   No issues  P:   Monitor glucose on BMET  NEUROLOGIC A:   Drug induced Encephalopathy  P:   D/C all sedation PRN fentanyl for pain (has not been needed thus far).  Discussed with TRH-MD and PCCM-NP.  Transfer to tele and to [facility] service with PCCM off 12/16.  Alyson ReedyWesam G. Jahrell Hamor, M.D. [facility] Pulmonary/Critical Care Medicine. Pager: [phone]. After hours pager: [phone].

## 2016-06-08 NOTE — Progress Notes (Signed)
Paged MD to restart home meds per pt request. Also informed MD of pt's elevated BP of systolic with 170's. WIll continue to monitor.

## 2016-06-08 NOTE — Progress Notes (Signed)
[place]N [place] Patient placed in chair, oriented to room, telemetry placed, vitals WNL, no questions at this time.  Minerva Endsiffany N Nysa Sarin RN

## 2016-06-08 NOTE — Progress Notes (Signed)
Pharmacy Antibiotic Note  Clarence PersonRichard Bradshaw is a 70 y.o. male admitted on 06/05/2016 with severe pharyngitis.  Pharmacy has been consulted for vancomycin and azactam dosing. -WBC= 27.2, afeb, PCT < 0.1, cultures pending with GP diplococci in trach cultures  Plan: Continue Vancomycin 500mg  IV q12hrs Continue Aztreonam 1gm IV q8hrs Check trough level at steady state Monitor labs, progress and cultures  Height: 5\' 9"  (175.3 cm) Weight: 145 lb 15.1 oz (66.2 kg) IBW/kg (Calculated) : 70.7  Temp (24hrs), Avg:98.6 F (37 C), Min:98.4 F (36.9 C), Max:98.9 F (37.2 C)   Recent Labs Lab 06/05/16 1352 06/06/16 0416 06/06/16 0500 06/07/16 0442 06/08/16 0355  WBC 29.2* 16.3*  --  27.2*  --   CREATININE 1.01  --  0.94 0.83 0.81    Estimated Creatinine Clearance: 78.3 mL/min (by C-G formula based on SCr of 0.81 mg/dL).    Allergies  Allergen Reactions  . Penicillins Anaphylaxis       . Demerol [Meperidine] Nausea Only   Antimicrobials this admission: Vancomycin 12/12 >>  Rocephin and Clindamycin 12/12 >> 12/12 Aztreonam 12/12 >>  Dose adjustments this admission:  Microbiology results:  12/12 resp- GP diplococci 12/13 blood x2 12/13 urine- neg MRSA PCR- neg  Thank you for allowing pharmacy to be a part of this patient's care.  Harland GermanAndrew Peggyann Zwiefelhofer, Pharm D 06/08/2016 8:21 AM

## 2016-06-08 NOTE — Progress Notes (Signed)
Patient has been drinking coffee, Sprite, and water with no problems swallowing.

## 2016-06-09 ENCOUNTER — Inpatient Hospital Stay (HOSPITAL_COMMUNITY): Payer: Medicare Other

## 2016-06-09 ENCOUNTER — Encounter (HOSPITAL_COMMUNITY): Payer: Self-pay | Admitting: Radiology

## 2016-06-09 DIAGNOSIS — J9601 Acute respiratory failure with hypoxia: Secondary | ICD-10-CM

## 2016-06-09 DIAGNOSIS — J029 Acute pharyngitis, unspecified: Secondary | ICD-10-CM

## 2016-06-09 LAB — BASIC METABOLIC PANEL
Anion gap: 8 (ref 5–15)
BUN: 22 mg/dL — AB (ref 6–20)
CALCIUM: 8.6 mg/dL — AB (ref 8.9–10.3)
CO2: 27 mmol/L (ref 22–32)
CREATININE: 0.73 mg/dL (ref 0.61–1.24)
Chloride: 101 mmol/L (ref 101–111)
GFR calc Af Amer: >60 mL/min (ref >60)
GLUCOSE: 130 mg/dL — AB (ref 65–99)
Potassium: 4.7 mmol/L (ref 3.5–5.1)
SODIUM: 136 mmol/L (ref 135–145)

## 2016-06-09 LAB — CBC
HCT: 34.9 % — ABNORMAL LOW (ref 39.0–52.0)
Hemoglobin: 11.8 g/dL — ABNORMAL LOW (ref 13.0–17.0)
MCH: 28.6 pg (ref 26.0–34.0)
MCHC: 33.8 g/dL (ref 30.0–36.0)
MCV: 84.5 fL (ref 78.0–100.0)
PLATELETS: 385 10*3/uL (ref 150–400)
RBC: 4.13 MIL/uL — AB (ref 4.22–5.81)
RDW: 14.3 % (ref 11.5–15.5)
WBC: 25.2 10*3/uL — ABNORMAL HIGH (ref 4.0–10.5)

## 2016-06-09 LAB — PHOSPHORUS: Phosphorus: 2.1 mg/dL — ABNORMAL LOW (ref 2.5–4.6)

## 2016-06-09 LAB — MAGNESIUM: MAGNESIUM: 1.9 mg/dL (ref 1.7–2.4)

## 2016-06-09 MED ORDER — IOPAMIDOL (ISOVUE-300) INJECTION 61%
INTRAVENOUS | Status: AC
  Administered 2016-06-09: 75 mL
  Filled 2016-06-09: qty 75

## 2016-06-09 MED ORDER — MUSCLE RUB 10-15 % EX CREA
TOPICAL_CREAM | CUTANEOUS | Status: DC | PRN
  Administered 2016-06-09: 07:00:00 via TOPICAL
  Filled 2016-06-09: qty 85

## 2016-06-09 MED ORDER — CLINDAMYCIN HCL 300 MG PO CAPS
300.0000 mg | ORAL_CAPSULE | Freq: Four times a day (QID) | ORAL | Status: DC
  Administered 2016-06-10 (×3): 300 mg via ORAL
  Filled 2016-06-09 (×4): qty 1

## 2016-06-09 MED ORDER — INFLUENZA VAC SPLIT QUAD 0.5 ML IM SUSY
0.5000 mL | PREFILLED_SYRINGE | INTRAMUSCULAR | Status: AC
  Administered 2016-06-10: 0.5 mL via INTRAMUSCULAR
  Filled 2016-06-09: qty 0.5

## 2016-06-09 MED ORDER — LISINOPRIL 5 MG PO TABS
5.0000 mg | ORAL_TABLET | Freq: Every day | ORAL | Status: DC
  Administered 2016-06-09 – 2016-06-10 (×2): 5 mg via ORAL
  Filled 2016-06-09 (×2): qty 1

## 2016-06-09 NOTE — Progress Notes (Addendum)
PROGRESS NOTE    Loki Wuthrich  ZOX:[id number] DOB: 12-22-1945 DOA: 06/05/2016  PCP: [facility]   Brief Narrative:   Clarence Bradshaw is an 70 y.o. male with COPD, smoker, HTN  who initially presented to [facility] ED on 12/12 with several days of progressively dyspnea, wheezing, sore throat, abdominal pain, nausea and emesis. He was previously placed on a 9 day course of Levaquin after presenting to PCP for sore throat, which did not provide any relief. He had multiple episodes of vomiting  as well up to the day of admission.   On 12/10, patient went to [facility] ED with dyspnea, where he received a nebulizer treatement and was checked for strep throat which was negative. Upon arrival to ED on 12/13 patient had severe pharyngeal edema (noted on CT) with unstable airway  and was intubated by ER physician. He was subsequently transferred to [facility]. He was treated with IV abx and IV steroid. He was extubated and transferred to floor on 12/15.  Subjective: He has no complaints- I note that he has a stridor which he states had been going on for at least 1 wk.   Assessment & Plan:   Principal Problem:   Acute pharyngitis with severe pharyngeal edema and acute resp failure (VDRF) - was on Levaquin as oupt - viral ? Bacterial? - still with stridor today- consulted ENT- recommended to obtain repeat CT chest to re-assess -cont steroids (on low dose now) and Antibiotics per ENT - resp culture showing diphtheroids - Addendum- CT reviewed- discussed CT findings with Dr Suszanne Conners- recommended Clindamycin and steroid taper on d/c- will start Clindamycin today  B/l basilar infiltrates - resp panel shows Rhinovirus/ Enterovirus- CXR show b/l basilar infiltrates L > R- ? Pneumonia vs atelectasis  Leukocytosis - due to steroids? Vs infection  Dysphagia - D 3 diet    Hypomagnesemia   Hypokalemia - on admission- replaced  Chronic systolic CHF - new diagnosis per  ECHO below - start Lisinopril- 5 mg daily - cont Coreg    COPD (chronic obstructive pulmonary disease)  - Albuterol PRN which he takes as outpt - stable    Tobacco use disorder - counseled to quit  HTN - Norvasc, Coreg on hold    Carotid artery stenosis - h/o right carotid endarterectomy - Left ICA "waveform is suggestive of intracranial stenosis, in addition to the cervical stenosis."   DVT prophylaxis: Lovenox Code Status: Full code Family Communication:  Disposition Plan: home when stable Consultants:   ENT  Procedures:   Carotid duplex Study Conclusions  - Left ventricle: Poor image quality mid and basal inferior wall   appear hypokinetic. Wall thickness was increased in a pattern of   moderate LVH. Systolic function was mildly reduced. The estimated   ejection fraction was in the range of 45% to 50%. Left   ventricular diastolic function parameters were normal. - Aortic valve: There was trivial regurgitation. - Left atrium: The atrium was moderately dilated. - Right atrium: The atrium was mildly dilated. - Atrial septum: No defect or patent foramen ovale was identified. - Pulmonary arteries: PA peak pressure: 49 mm Hg (S).   Antimicrobials:  Anti-infectives    Start     Dose/Rate Route Frequency Ordered Stop   06/06/16 1000  vancomycin (VANCOCIN) 500 mg in sodium chloride 0.9 % 100 mL IVPB     500 mg 100 mL/hr over 60 Minutes Intravenous Every 12 hours 06/05/16 2025     06/05/16  2200  aztreonam (AZACTAM) 1 g in dextrose 5 % 50 mL IVPB     1 g 100 mL/hr over 30 Minutes Intravenous Every 8 hours 06/05/16 2108     06/05/16 2030  cefTRIAXone (ROCEPHIN) 1 g in dextrose 5 % 50 mL IVPB  Status:  Discontinued     1 g 100 mL/hr over 30 Minutes Intravenous Every 24 hours 06/05/16 2020 06/05/16 2042   06/05/16 2030  vancomycin (VANCOCIN) IVPB 1000 mg/200 mL premix     1,000 mg 200 mL/hr over 60 Minutes Intravenous  Once 06/05/16 2023 06/05/16 2314   06/05/16  1445  clindamycin (CLEOCIN) IVPB 600 mg     600 mg 100 mL/hr over 30 Minutes Intravenous  Once 06/05/16 1439 06/05/16 1600       Objective: Vitals:   06/08/16 2012 06/08/16 2054 06/09/16 0502 06/09/16 0800  BP:   (!) 156/68   Pulse:  80 82 96  Resp:  18 18   Temp:  98.7 F (37.1 C) 98.2 F (36.8 C)   TempSrc:  Oral Oral   SpO2: 96% 98% 96%   Weight:      Height:        Intake/Output Summary (Last 24 hours) at 06/09/16 1414 Last data filed at 06/08/16 1900  Gross per 24 hour  Intake              290 ml  Output             1350 ml  Net            -1060 ml   Filed Weights   06/05/16 1627 06/06/16 0500 06/07/16 0500  Weight: 59 kg (130 lb) 58.9 kg (129 lb 13.6 oz) 66.2 kg (145 lb 15.1 oz)    Examination: General exam: Appears comfortable  HEENT: PERRLA, oral mucosa moist, no sclera icterus or thrush Respiratory system: Clear to auscultation. Respiratory effort normal. + stridor- in no resp distress- pulse ox > 95% Cardiovascular system: S1 & S2 heard, RRR.  No murmurs  Gastrointestinal system: Abdomen soft, non-tender, nondistended. Normal bowel sound. No organomegaly Central nervous system: Alert and oriented. No focal neurological deficits. Extremities: No cyanosis, clubbing or edema Skin: No rashes or ulcers Psychiatry:  Mood & affect appropriate.     Data Reviewed: I have personally reviewed following labs and imaging studies  CBC:  Recent Labs Lab 06/05/16 1352 06/06/16 0416 06/07/16 0442 06/09/16 0235  WBC 29.2* 16.3* 27.2* 25.2*  NEUTROABS 21.9* 14.7* 25.6*  --   HGB 12.3* 11.2* 11.1* 11.8*  HCT 37.1* 32.1* 32.8* 34.9*  MCV 86.3 86.1 85.2 84.5  PLT 520* 380 380 385   Basic Metabolic Panel:  Recent Labs Lab 06/05/16 1352 06/05/16 1749 06/06/16 0416 06/06/16 0500 06/06/16 1448 06/07/16 0442 06/08/16 0355 06/09/16 0235  NA 135  --   --  136  --  138 133* 136  K 2.9*  --   --  3.8  --  4.2 4.7 4.7  CL 94*  --   --  105  --  107 104 101    CO2 29  --   --  22  --  26 24 27   GLUCOSE 90  --   --  135*  --  145* 122* 130*  BUN 25*  --   --  28*  --  28* 32* 22*  CREATININE 1.01  --   --  0.94  --  0.83 0.81 0.73  CALCIUM 7.0*  --   --  6.2*  --  6.8* 7.8* 8.6*  MG  --  0.5* 3.2*  --  3.0* 2.5*  --  1.9  PHOS  --  4.0  --   --  4.3 3.1  --  2.1*   GFR: Estimated Creatinine Clearance: 79.3 mL/min (by C-G formula based on SCr of 0.73 mg/dL). Liver Function Tests:  Recent Labs Lab 06/06/16 0500 06/07/16 0442 06/08/16 0355  AST 19 20 44*  ALT 16* 16* 32  ALKPHOS 39 38 61  BILITOT 0.4 0.3 0.8  PROT 5.6* 5.6* 6.5  ALBUMIN 2.7* 2.5* 3.0*   No results for input(s): LIPASE, AMYLASE in the last 168 hours. No results for input(s): AMMONIA in the last 168 hours. Coagulation Profile: No results for input(s): INR, PROTIME in the last 168 hours. Cardiac Enzymes: No results for input(s): CKTOTAL, CKMB, CKMBINDEX, TROPONINI in the last 168 hours. BNP (last 3 results) No results for input(s): PROBNP in the last 8760 hours. HbA1C: No results for input(s): HGBA1C in the last 72 hours. CBG:  Recent Labs Lab 06/06/16 1936 06/07/16 0029 06/07/16 0318 06/07/16 0816 06/07/16 1218  GLUCAP 152* 136* 145* 147* 132*   Lipid Profile:  Recent Labs  06/07/16 0442  TRIG 206*   Thyroid Function Tests: No results for input(s): TSH, T4TOTAL, FREET4, T3FREE, THYROIDAB in the last 72 hours. Anemia Panel: No results for input(s): VITAMINB12, FOLATE, FERRITIN, TIBC, IRON, RETICCTPCT in the last 72 hours. Urine analysis: No results found for: COLORURINE, APPEARANCEUR, LABSPEC, PHURINE, GLUCOSEU, HGBUR, BILIRUBINUR, KETONESUR, PROTEINUR, UROBILINOGEN, NITRITE, LEUKOCYTESUR Sepsis Labs: @LABRCNTIP (procalcitonin:4,lacticidven:4) ) Recent Results (from the past 240 hour(s))  MRSA PCR Screening     Status: None   Collection Time: 06/05/16  8:21 PM  Result Value Ref Range Status   MRSA by PCR NEGATIVE NEGATIVE Final    Comment:         The GeneXpert MRSA Assay (FDA approved for NASAL specimens only), is one component of a comprehensive MRSA colonization surveillance program. It is not intended to diagnose MRSA infection nor to guide or monitor treatment for MRSA infections.   Respiratory Panel by PCR     Status: Abnormal   Collection Time: 06/06/16  1:18 PM  Result Value Ref Range Status   Adenovirus NOT DETECTED NOT DETECTED Final   Coronavirus 229E NOT DETECTED NOT DETECTED Final   Coronavirus HKU1 NOT DETECTED NOT DETECTED Final   Coronavirus NL63 NOT DETECTED NOT DETECTED Final   Coronavirus OC43 NOT DETECTED NOT DETECTED Final   Metapneumovirus NOT DETECTED NOT DETECTED Final   Rhinovirus / Enterovirus DETECTED (A) NOT DETECTED Final   Influenza A NOT DETECTED NOT DETECTED Final   Influenza B NOT DETECTED NOT DETECTED Final   Parainfluenza Virus 1 NOT DETECTED NOT DETECTED Final   Parainfluenza Virus 2 NOT DETECTED NOT DETECTED Final   Parainfluenza Virus 3 NOT DETECTED NOT DETECTED Final   Parainfluenza Virus 4 NOT DETECTED NOT DETECTED Final   Respiratory Syncytial Virus NOT DETECTED NOT DETECTED Final   Bordetella pertussis NOT DETECTED NOT DETECTED Final   Chlamydophila pneumoniae NOT DETECTED NOT DETECTED Final   Mycoplasma pneumoniae NOT DETECTED NOT DETECTED Final  Culture, Urine     Status: None   Collection Time: 06/06/16  1:41 PM  Result Value Ref Range Status   Specimen Description URINE, CATHETERIZED  Final   Special Requests NONE  Final   Culture NO GROWTH  Final   Report Status 06/07/2016 FINAL  Final  Culture, blood (routine x 2)  Status: None (Preliminary result)   Collection Time: 06/06/16  1:58 PM  Result Value Ref Range Status   Specimen Description BLOOD RIGHT HAND  Final   Special Requests IN PEDIATRIC BOTTLE 1CC  Final   Culture NO GROWTH 2 DAYS  Final   Report Status PENDING  Incomplete  Culture, blood (routine x 2)     Status: None (Preliminary result)   Collection  Time: 06/06/16  2:08 PM  Result Value Ref Range Status   Specimen Description BLOOD RIGHT HAND  Final   Special Requests IN PEDIATRIC BOTTLE 1CC  Final   Culture NO GROWTH 2 DAYS  Final   Report Status PENDING  Incomplete  Culture, respiratory (NON-Expectorated)     Status: None (Preliminary result)   Collection Time: 06/06/16  5:52 PM  Result Value Ref Range Status   Specimen Description TRACHEAL ASPIRATE  Final   Special Requests NONE  Final   Gram Stain   Final    FEW WBC PRESENT, PREDOMINANTLY PMN ABUNDANT GRAM POSITIVE DIPLOCOCCI FEW YEAST RARE SQUAMOUS EPITHELIAL CELLS PRESENT    Culture   Final    MODERATE DIPHTHEROIDS(CORYNEBACTERIUM SPECIES) Standardized susceptibility testing for this organism is not available. CULTURE REINCUBATED FOR BETTER GROWTH    Report Status PENDING  Incomplete         Radiology Studies: No results found.    Scheduled Meds: . iopamidol      . amLODipine  10 mg Oral Daily  . atorvastatin  20 mg Oral q1800  . aztreonam  1 g Intravenous Q8H  . carvedilol  25 mg Oral BID WC  . chlorhexidine  15 mL Mouth Rinse BID  . enoxaparin (LOVENOX) injection  40 mg Subcutaneous Q24H  . feeding supplement (ENSURE ENLIVE)  237 mL Oral BID BM  . [START ON 06/10/2016] Influenza vac split quadrivalent PF  0.5 mL Intramuscular Tomorrow-1000  . ipratropium-albuterol  3 mL Nebulization Q6H  . mouth rinse  15 mL Mouth Rinse q12n4p  . methylPREDNISolone sodium succinate  20 mg Intravenous Q12H  . pantoprazole  40 mg Oral BID  . vancomycin  500 mg Intravenous Q12H   Continuous Infusions:   LOS: 4 days    Time spent in minutes: 35    Ardon Franklin, MD [facility] Pager: [email or web address] Password [email or web address] 06/09/2016, 2:14 PM

## 2016-06-09 NOTE — Consult Note (Signed)
Reason for Consult: Noisy breathing, severe pharyngitis/laryngitis  HPI:  Clarence Bradshaw is an 70 y.o. male who initially presented to [facility] ED on 12/12 with several days of progressively dyspnea, wheezing, sore throat, abdominal pain, nausea and emesis. He was previously placed on a 9 day course of Levaquin after presenting to PCP for sore throat, which did not provide any relief. On 12/10, patient went to [facility] ED with dyspnea, where he received a nebulizer treatement and was checked for strep throat which was negative. Upon arrival to ED on 12/13 patient had severe pharyngeal edema with unstable airway in which he was intubated by ER physician. He was subsequently transferred to [facility]. He was treated with IV abx and IV steroid. He was extubated and transferred to floor yesterday. Despite improvement in his sore throat and dyspnea, he continues to have noisy breathing. ENT is consulted for further evaluation.  Past Medical History:  Diagnosis Date  . COPD (chronic obstructive pulmonary disease) ([place])   . Hypertension     Past Surgical History:  Procedure Laterality Date  . BACK SURGERY    . CARDIAC SURGERY    . CORONARY ARTERY BYPASS GRAFT     x3  . PERCUTANEOUS PLACEMENT INTRAVASCULAR STENT CERVICAL CAROTID ARTERY      History reviewed. No pertinent family history.  Social History:  reports that he has been smoking Cigarettes.  He has been smoking about 1.00 pack per day. He has quit using smokeless tobacco. He reports that he does not drink alcohol. His drug history is not on file.  Allergies:  Allergies  Allergen Reactions  . Penicillins Anaphylaxis       . Demerol [Meperidine] Nausea Only    Prior to Admission medications   Medication Sig Start Date End Date Taking? Authorizing Provider  acetaminophen (TYLENOL) 500 MG tablet Take 500 mg by mouth every 4 (four) hours as needed for moderate pain or headache.   Yes Historical Provider, MD  albuterol  (PROVENTIL HFA;VENTOLIN HFA) 108 (90 Base) MCG/ACT inhaler Inhale 1-2 puffs into the lungs every 6 (six) hours as needed for wheezing or shortness of breath.   Yes Historical Provider, MD  amLODipine (NORVASC) 10 MG tablet Take 10 mg by mouth daily.   Yes Historical Provider, MD  Calcium Carbonate 500 MG CHEW Chew 500 mg by mouth daily.   Yes Historical Provider, MD  carvedilol (COREG) 25 MG tablet Take 25 mg by mouth 2 (two) times daily with a meal.   Yes Historical Provider, MD  Cholecalciferol (VITAMIN D3) 5000 units TABS Take 5,000 Units by mouth daily at 2 PM.    Yes Historical Provider, MD  fluticasone (FLONASE) 50 MCG/ACT nasal spray Place 2 sprays into both nostrils daily.   Yes Historical Provider, MD  ibuprofen (ADVIL,MOTRIN) 600 MG tablet Take 600 mg by mouth 2 (two) times daily as needed for headache or moderate pain.   Yes Historical Provider, MD  levofloxacin (LEVAQUIN) 750 MG tablet Take 750 mg by mouth daily. FOR 10 DAYS   Yes Historical Provider, MD  magnesium oxide (MAG-OX) 400 MG tablet Take 400 mg by mouth daily.   Yes Historical Provider, MD  naproxen (NAPROSYN) 500 MG tablet Take 500 mg by mouth 2 (two) times daily with a meal.   Yes Historical Provider, MD  nortriptyline (PAMELOR) 25 MG capsule Take 25 mg by mouth at bedtime.   Yes Historical Provider, MD  omeprazole (PRILOSEC) 20 MG capsule Take 20 mg by mouth 2 (two)  times daily before a meal.   Yes Historical Provider, MD  predniSONE (DELTASONE) 20 MG tablet Take 20 mg by mouth daily with breakfast. FOR 6 DAYS   Yes Historical Provider, MD  sildenafil (VIAGRA) 100 MG tablet Take 100 mg by mouth daily as needed for erectile dysfunction.   Yes Historical Provider, MD  simvastatin (ZOCOR) 40 MG tablet Take 60 mg by mouth daily.   Yes Historical Provider, MD    Medications:  I have reviewed the patient's current medications. Scheduled: . iopamidol      . amLODipine  10 mg Oral Daily  . atorvastatin  20 mg Oral q1800  .  aztreonam  1 g Intravenous Q8H  . carvedilol  25 mg Oral BID WC  . chlorhexidine  15 mL Mouth Rinse BID  . enoxaparin (LOVENOX) injection  40 mg Subcutaneous Q24H  . feeding supplement (ENSURE ENLIVE)  237 mL Oral BID BM  . [START ON 06/10/2016] Influenza vac split quadrivalent PF  0.5 mL Intramuscular Tomorrow-1000  . ipratropium-albuterol  3 mL Nebulization Q6H  . mouth rinse  15 mL Mouth Rinse q12n4p  . methylPREDNISolone sodium succinate  20 mg Intravenous Q12H  . pantoprazole  40 mg Oral BID  . vancomycin  500 mg Intravenous Q12H   OIB:BCWUGQBVQ, calcium carbonate, MUSCLE RUB  Results for orders placed or performed during the hospital encounter of 06/05/16 (from the past 48 hour(s))  Comprehensive metabolic panel     Status: Abnormal   Collection Time: 06/08/16  3:55 AM  Result Value Ref Range   Sodium 133 (L) 135 - 145 mmol/L   Potassium 4.7 3.5 - 5.1 mmol/L   Chloride 104 101 - 111 mmol/L   CO2 24 22 - 32 mmol/L   Glucose, Bld 122 (H) 65 - 99 mg/dL   BUN 32 (H) 6 - 20 mg/dL   Creatinine, Ser 0.81 0.61 - 1.24 mg/dL   Calcium 7.8 (L) 8.9 - 10.3 mg/dL   Total Protein 6.5 6.5 - 8.1 g/dL   Albumin 3.0 (L) 3.5 - 5.0 g/dL   AST 44 (H) 15 - 41 U/L   ALT 32 17 - 63 U/L   Alkaline Phosphatase 61 38 - 126 U/L   Total Bilirubin 0.8 0.3 - 1.2 mg/dL   GFR calc non Af Amer >60 >60 mL/min   GFR calc Af Amer >60 >60 mL/min    Comment: (NOTE) The eGFR has been calculated using the CKD EPI equation. This calculation has not been validated in all clinical situations. eGFR's persistently <60 mL/min signify possible Chronic Kidney Disease.    Anion gap 5 5 - 15  Procalcitonin     Status: None   Collection Time: 06/08/16  3:55 AM  Result Value Ref Range   Procalcitonin <0.10 ng/mL    Comment:        Interpretation: PCT (Procalcitonin) <= 0.5 ng/mL: Systemic infection (sepsis) is not likely. Local bacterial infection is possible. (NOTE)         ICU PCT Algorithm               Non  ICU PCT Algorithm    ----------------------------     ------------------------------         PCT < 0.25 ng/mL                 PCT < 0.1 ng/mL     Stopping of antibiotics            Stopping of antibiotics  strongly encouraged.               strongly encouraged.    ----------------------------     ------------------------------       PCT level decrease by               PCT < 0.25 ng/mL       >= 80% from peak PCT       OR PCT 0.25 - 0.5 ng/mL          Stopping of antibiotics                                             encouraged.     Stopping of antibiotics           encouraged.    ----------------------------     ------------------------------       PCT level decrease by              PCT >= 0.25 ng/mL       < 80% from peak PCT        AND PCT >= 0.5 ng/mL            Continuin g antibiotics                                              encouraged.       Continuing antibiotics            encouraged.    ----------------------------     ------------------------------     PCT level increase compared          PCT > 0.5 ng/mL         with peak PCT AND          PCT >= 0.5 ng/mL             Escalation of antibiotics                                          strongly encouraged.      Escalation of antibiotics        strongly encouraged.   CBC     Status: Abnormal   Collection Time: 06/09/16  2:35 AM  Result Value Ref Range   WBC 25.2 (H) 4.0 - 10.5 K/uL   RBC 4.13 (L) 4.22 - 5.81 MIL/uL   Hemoglobin 11.8 (L) 13.0 - 17.0 g/dL   HCT 34.9 (L) 39.0 - 52.0 %   MCV 84.5 78.0 - 100.0 fL   MCH 28.6 26.0 - 34.0 pg   MCHC 33.8 30.0 - 36.0 g/dL   RDW 14.3 11.5 - 15.5 %   Platelets 385 150 - 400 K/uL  Basic metabolic panel     Status: Abnormal   Collection Time: 06/09/16  2:35 AM  Result Value Ref Range   Sodium 136 135 - 145 mmol/L   Potassium 4.7 3.5 - 5.1 mmol/L   Chloride 101 101 - 111 mmol/L   CO2 27 22 - 32 mmol/L   Glucose, Bld 130 (H) 65 - 99 mg/dL   BUN 22 (H) 6 - 20 mg/dL    Creatinine, Ser 0.73  0.61 - 1.24 mg/dL   Calcium 8.6 (L) 8.9 - 10.3 mg/dL   GFR calc non Af Amer >60 >60 mL/min   GFR calc Af Amer >60 >60 mL/min    Comment: (NOTE) The eGFR has been calculated using the CKD EPI equation. This calculation has not been validated in all clinical situations. eGFR's persistently <60 mL/min signify possible Chronic Kidney Disease.    Anion gap 8 5 - 15  Magnesium     Status: None   Collection Time: 06/09/16  2:35 AM  Result Value Ref Range   Magnesium 1.9 1.7 - 2.4 mg/dL  Phosphorus     Status: Abnormal   Collection Time: 06/09/16  2:35 AM  Result Value Ref Range   Phosphorus 2.1 (L) 2.5 - 4.6 mg/dL    No results found.  Review of Systems  HENT: Voice change (muffled voice) has resolved.   Respiratory: Shortness of breath has also resolved. Pt continues to have noisy upper respiratory sound.   Gastrointestinal: Negative for abdominal pain, nausea and vomiting.  Neurological: Negative. All other systems reviewed and are negative.  Blood pressure (!) 156/68, pulse 96, temperature 98.2 F (36.8 C), temperature source Oral, resp. rate 18, height 5' 9"  (1.753 m), weight 145 lb 15.1 oz (66.2 kg), SpO2 96 %. Physical Exam  Constitutional: He is oriented to person, place, and time. He appears well-developed and well-nourished. Voice is normal. Head: Normocephalic.  Ears: Normal auricles and EACs. Mouth/Throat: Uvula is midline. No significant edema or erythema. Tongue normal.  Eyes: Conjunctivae are normal.  Neck: Neck supple. No tracheal deviation present.  Cardiovascular: Normal rate.   Pulmonary/Chest: Effort normal. No Stridor. Occasional noisy upper respiratory breathing. Stridor. Lung sounds clear. Neurological: He is alert and oriented to person, place, and time.  Skin: Skin is warm and dry.  Psychiatric: He has a normal mood and affect.  Nursing note and vitals reviewed.  Assessment/Plan: Recent acute pharyngitis/laryngitis. Extubated.  Clinically improved. Still has some noisy upper respiratory breathing. Plan repeat CT neck with contrast today. Consider switching to oral abx. Will follow.  Mekhi Sonn,SUI W 06/09/2016, 1:27 PM

## 2016-06-09 NOTE — Evaluation (Signed)
Physical Therapy Evaluation Patient Details Name: Clarence Bradshaw MRN: [id number] DOB: 09/13/1945 Today's Date: 06/09/2016   History of Present Illness  70 yo male with acute respiratory failure and recurrent pharyngitis, intubated and extubated well, encephalopathy.  PMHx:  COPD, HTN, CABG, stents  Clinical Impression  Pt is walking with PT and IV pole, able to maneuver and control balance but no AD used yet.  Expect this progression with nursing to assist for more walks today.  Will follow acutely and focus on LE strength and control of balance and will add AD if needed prior to DC home.    Follow Up Recommendations No PT follow up    Equipment Recommendations  None recommended by PT    Recommendations for Other Services       Precautions / Restrictions Precautions Precautions: Fall (telemetry) Restrictions Weight Bearing Restrictions: No      Mobility  Bed Mobility Overal bed mobility: Needs Assistance Bed Mobility: Supine to Sit     Supine to sit: Min assist        Transfers Overall transfer level: Needs assistance Equipment used: 1 person hand held assist Transfers: Sit to/from [employer] CorporationStand;Stand Pivot Transfers Sit to Stand: Min guard Stand pivot transfers: Min guard       General transfer comment: cued hand placement and has used IV pole to steady in standing  Ambulation/Gait Ambulation/Gait assistance: Min guard;Min assist Ambulation Distance (Feet): 250 Feet Assistive device: 1 person hand held assist (IV pole) Gait Pattern/deviations: Step-through pattern;Decreased stride length;Wide base of support (IV pole to steady) Gait velocity: reduced Gait velocity interpretation: Below normal speed for age/gender General Gait Details: careful progression with placement of feet, stiffness of DF affecting his quality of gait  Stairs Stairs: Yes Stairs assistance: Min guard Stair Management: One rail Right;Alternating pattern;Forwards Number of Stairs: 3     Wheelchair Mobility    Modified Rankin (Stroke Patients Only)       Balance Overall balance assessment: Needs assistance Sitting-balance support: Feet supported Sitting balance-Leahy Scale: Fair     Standing balance support: Single extremity supported Standing balance-Leahy Scale: Fair                               Pertinent Vitals/Pain Pain Assessment: No/denies pain    Home Living Family/patient expects to be discharged to:: Private residence Living Arrangements: Non-relatives/Friends Available Help at Discharge: Friend(s);Available 24 hours/day Type of Home: House Home Access: Stairs to enter Entrance Stairs-Rails: Right;Left;Can reach both Entrance Stairs-Number of Steps: 3 Home Layout: One level Home Equipment: None      Prior Function Level of Independence: Independent               Hand Dominance        Extremity/Trunk Assessment   Upper Extremity Assessment Upper Extremity Assessment: Overall WFL for tasks assessed    Lower Extremity Assessment Lower Extremity Assessment: Overall WFL for tasks assessed    Cervical / Trunk Assessment Cervical / Trunk Assessment: Normal  Communication   Communication: No difficulties  Cognition Arousal/Alertness: Awake/alert Behavior During Therapy: WFL for tasks assessed/performed Overall Cognitive Status: Within Functional Limits for tasks assessed                      General Comments      Exercises     Assessment/Plan    PT Assessment Patient needs continued PT services  PT Problem List Decreased strength;Decreased range of motion;Decreased  activity tolerance;Decreased balance;Decreased mobility;Decreased coordination;Decreased knowledge of use of DME;Decreased safety awareness;Cardiopulmonary status limiting activity;Decreased skin integrity          PT Treatment Interventions DME instruction;Gait training;Stair training;Therapeutic activities;Functional mobility  training;Therapeutic exercise;Balance training;Neuromuscular re-education;Patient/family education    PT Goals (Current goals can be found in the Care Plan section)  Acute Rehab PT Goals Patient Stated Goal: to get home independently PT Goal Formulation: With patient Time For Goal Achievement: 06/23/16 Potential to Achieve Goals: Good    Frequency Min 2X/week   Barriers to discharge Inaccessible home environment stairs but has practiced    Co-evaluation               End of Session Equipment Utilized During Treatment: Gait belt Activity Tolerance: Patient tolerated treatment well Patient left: with call bell/phone within reach;in bed;with bed alarm set;with nursing/sitter in room (informed nursing that the O2 sats were 97% at steps) Nurse Communication: Mobility status         Time: [id number] PT Time Calculation (min) (ACUTE ONLY): 21 min   Charges:   PT Evaluation $PT Eval Moderate Complexity: 1 Procedure     PT G CodesIvar Drape:        [name] E 06/09/2016, [name], PT MS Acute Rehab Dept. Number: [facility] [phone] and [facility] [phone]

## 2016-06-10 DIAGNOSIS — E876 Hypokalemia: Secondary | ICD-10-CM

## 2016-06-10 DIAGNOSIS — J9601 Acute respiratory failure with hypoxia: Secondary | ICD-10-CM

## 2016-06-10 DIAGNOSIS — I5022 Chronic systolic (congestive) heart failure: Secondary | ICD-10-CM

## 2016-06-10 DIAGNOSIS — J029 Acute pharyngitis, unspecified: Secondary | ICD-10-CM

## 2016-06-10 DIAGNOSIS — Z9911 Dependence on respirator [ventilator] status: Secondary | ICD-10-CM

## 2016-06-10 DIAGNOSIS — I6529 Occlusion and stenosis of unspecified carotid artery: Secondary | ICD-10-CM

## 2016-06-10 LAB — CULTURE, RESPIRATORY

## 2016-06-10 LAB — CULTURE, RESPIRATORY W GRAM STAIN

## 2016-06-10 LAB — CBC
HCT: 32.7 % — ABNORMAL LOW (ref 39.0–52.0)
Hemoglobin: 11.2 g/dL — ABNORMAL LOW (ref 13.0–17.0)
MCH: 28.9 pg (ref 26.0–34.0)
MCHC: 34.3 g/dL (ref 30.0–36.0)
MCV: 84.3 fL (ref 78.0–100.0)
PLATELETS: 327 10*3/uL (ref 150–400)
RBC: 3.88 MIL/uL — AB (ref 4.22–5.81)
RDW: 14.2 % (ref 11.5–15.5)
WBC: 18.9 10*3/uL — ABNORMAL HIGH (ref 4.0–10.5)

## 2016-06-10 MED ORDER — ALBUTEROL SULFATE (2.5 MG/3ML) 0.083% IN NEBU: 2.5000 mg | INHALATION_SOLUTION | RESPIRATORY_TRACT | Status: DC | PRN

## 2016-06-10 MED ORDER — ATORVASTATIN CALCIUM 20 MG PO TABS: 20.0000 mg | ORAL_TABLET | Freq: Every day | ORAL | 0 refills | Status: DC

## 2016-06-10 MED ORDER — PREDNISONE 10 MG (21) PO TBPK: 10.0000 mg | ORAL_TABLET | Freq: Every day | ORAL | 0 refills | Status: DC

## 2016-06-10 MED ORDER — CLINDAMYCIN HCL 300 MG PO CAPS: 300.0000 mg | ORAL_CAPSULE | Freq: Four times a day (QID) | ORAL | 0 refills | Status: DC

## 2016-06-10 MED ORDER — LISINOPRIL 5 MG PO TABS: 5.0000 mg | ORAL_TABLET | Freq: Every day | ORAL | 0 refills | Status: DC

## 2016-06-10 MED ORDER — NAPROXEN 500 MG PO TABS: 500.0000 mg | ORAL_TABLET | Freq: Two times a day (BID) | ORAL | Status: DC | PRN

## 2016-06-10 MED ORDER — IPRATROPIUM-ALBUTEROL 0.5-2.5 (3) MG/3ML IN SOLN: 3.0000 mL | Freq: Two times a day (BID) | RESPIRATORY_TRACT | Status: DC

## 2016-06-10 NOTE — Progress Notes (Signed)
Subjective: No new issues. Tolerated po intake. No breathing difficulty.  Objective: Vital signs in last 24 hours: Temp:  [97.7 F (36.5 C)-98.5 F (36.9 C)] 98.5 F (36.9 C) (12/17 0810) Pulse Rate:  [72-85] 85 (12/17 0810) Resp:  [18-20] 20 (12/17 0810) BP: (134-150)/(54-71) 150/71 (12/17 0810) SpO2:  [97 %-99 %] 97 % (12/17 0810)  Physical Exam Constitutional: He is oriented to person, place, and time. He appears well-developedand well-nourished. Voiceis normal. Head: Normocephalic.  Ears: Normal auricles and EACs. Mouth/Throat: Uvula is midline. No significant edema or erythema. Tongue normal. Eyes: Conjunctivaeare normal.  Neck: Neck supple. No tracheal deviationpresent.  Cardiovascular: Normal rate.  Pulmonary/Chest: Effort normal. No Stridor. Occasional noisy upper respiratory breathing. Stridor. Lung sounds clear. Neurological: He is alertand oriented to person, place, and time.  Skin: Skin is warmand dry.  Psychiatric: He has a normal mood and affect.    Recent Labs  06/09/16 0235 06/10/16 0217  WBC 25.2* 18.9*  HGB 11.8* 11.2*  HCT 34.9* 32.7*  PLT 385 327    Recent Labs  06/08/16 0355 06/09/16 0235  NA 133* 136  K 4.7 4.7  CL 104 101  CO2 24 27  GLUCOSE 122* 130*  BUN 32* 22*  CREATININE 0.81 0.73  CALCIUM 7.8* 8.6*    Assessment/Plan: Recent acute pharyngitis/laryngitis. Extubated. Significantly improved. Still has some noisy upper respiratory breathing. Per patient this is his baseline. CT yesterday showed no abscess. May d/c home on clindamycin 300mg  QID for 7 days. Pt may follow up with me as an outpatient in 1-2 weeks.   LOS: 5 days   Kanitra Purifoy,SUI W 06/10/2016, 9:36 AM

## 2016-06-10 NOTE — Discharge Summary (Signed)
Physician Discharge Summary  Clarence Bradshaw RUE:[id number] DOB: 01-27-1946 DOA: 06/05/2016  PCP: [facility]  Admit date: 06/05/2016 Discharge date: 06/12/2016  Admitted From: home Disposition:  home   Recommendations for Outpatient Follow-up:  1. Cardiology referral for new CHF and eval for underlying CAD 2. Cmet in 1wk- new start on Lisinopril and statin  Home Health:  none    Discharge Condition:  stable   CODE STATUS:  Full code   Diet recommendation:  Heart healthy, low sodium Consultations:  ENT    Discharge Diagnoses:  Principal Problem:   Acute pharyngitis Active Problems:   Hypomagnesemia   Hypokalemia   On mechanically assisted ventilation (HCC)   COPD (chronic obstructive pulmonary disease) (HCC)   Tobacco use disorder   Carotid artery stenosis   Acute respiratory failure with hypoxia (HCC)    Subjective: Mild cough. No other complaints.   Brief Summary: [name] an 70 y.o.male with COPD, smoker, HTN [facility] several days of progressively dyspnea, wheezing, sore throat, abdominal pain, nausea and emesis. Hewas previouslyplaced on a 9 day course of Levaquin after presenting to PCP for sore throat, which did not provideany relief. He had multiple episodes of vomiting  as well up to the day of admission.   On 12/10,patient went to [facility] ED with dyspnea, where he received a nebulizer treatement and was checked for strep throat which was negative. Upon arrival to ED on 12/13 patient had severe pharyngeal edema (noted on CT) with unstable airway  and was intubated by ER physician. He was subsequently transferred to [facility]. He was treated with IV abx and IV steroid. He was extubated and transferred to floor on 12/15.   Hospital Course:  Principal Problem:   Acute pharyngitis with severe pharyngeal edema and acute resp failure (VDRF) - was on Levaquin as oupt -  See below Rhinovirus per Resp panel-  Bacterial superinfection? - - consulted ENT- recommended to obtain repeat CT chest to re-assess- this shows resolution of edema -cont steroids (on low dose now) and Antibiotics per ENT>>> Clinda x 1 wk and Prednisone taper x 1 wk and then f/u with Dr Suszanne Conners - resp culture showing diphtheroids  B/l basilar infiltrates - resp panel shows Rhinovirus/ Enterovirus - CXR show b/l basilar infiltrates L > R- ? Pneumonia vs atelectasis- ha a minimal cough - as above, complete a course of Clinda x 1 wk  Leukocytosis - due to steroids? Vs infection- improved today  Dysphagia - D 3 diet    Hypomagnesemia   Hypokalemia - on admission- replaced  Chronic systolic CHF - new diagnosis per ECHO below - start Lisinopril- 5 mg daily - cont Coreg - discussed with patient in detail    COPD (chronic obstructive pulmonary disease)  - Albuterol PRN which he takes as outpt - stable    Tobacco use disorder - counseled to quit  HTN - Norvasc, Coreg, Lisinopril     Carotid artery stenosis - h/o right carotid endarterectomy - Left ICA "waveform is suggestive of intracranial stenosis, in addition to the cervical stenosis."    Discharge Instructions  Discharge Instructions    Diet - low sodium heart healthy    Complete by:  As directed    Increase activity slowly    Complete by:  As directed      Allergies as of 06/10/2016      Reactions   Penicillins Anaphylaxis  Demerol [meperidine] Nausea Only      Medication List    STOP taking these medications   levofloxacin 750 MG tablet Commonly known as:  LEVAQUIN   predniSONE 20 MG tablet Commonly known as:  DELTASONE Replaced by:  predniSONE 10 MG (21) Tbpk tablet     TAKE these medications   acetaminophen 500 MG tablet Commonly known as:  TYLENOL Take 500 mg by mouth every 4 (four) hours as needed for moderate pain or headache.   albuterol 108 (90 Base) MCG/ACT inhaler Commonly  known as:  PROVENTIL HFA;VENTOLIN HFA Inhale 1-2 puffs into the lungs every 6 (six) hours as needed for wheezing or shortness of breath.   amLODipine 10 MG tablet Commonly known as:  NORVASC Take 10 mg by mouth daily.   atorvastatin 20 MG tablet Commonly known as:  LIPITOR Take 1 tablet (20 mg total) by mouth daily at 6 PM.   Calcium Carbonate 500 MG Chew Chew 500 mg by mouth daily.   carvedilol 25 MG tablet Commonly known as:  COREG Take 25 mg by mouth 2 (two) times daily with a meal.   clindamycin 300 MG capsule Commonly known as:  CLEOCIN Take 1 capsule (300 mg total) by mouth every 6 (six) hours.   fluticasone 50 MCG/ACT nasal spray Commonly known as:  FLONASE Place 2 sprays into both nostrils daily.   ibuprofen 600 MG tablet Commonly known as:  ADVIL,MOTRIN Take 600 mg by mouth 2 (two) times daily as needed for headache or moderate pain.   lisinopril 5 MG tablet Commonly known as:  PRINIVIL,ZESTRIL Take 1 tablet (5 mg total) by mouth daily.   magnesium oxide 400 MG tablet Commonly known as:  MAG-OX Take 400 mg by mouth daily.   naproxen 500 MG tablet Commonly known as:  NAPROSYN Take 1 tablet (500 mg total) by mouth 2 (two) times daily as needed. What changed:  when to take this  reasons to take this   nortriptyline 25 MG capsule Commonly known as:  PAMELOR Take 25 mg by mouth at bedtime.   omeprazole 20 MG capsule Commonly known as:  PRILOSEC Take 20 mg by mouth 2 (two) times daily before a meal.   predniSONE 10 MG (21) Tbpk tablet Commonly known as:  STERAPRED UNI-PAK 21 TAB Take 1 tablet (10 mg total) by mouth daily. Taper by 10 mg daily until finished Replaces:  predniSONE 20 MG tablet   sildenafil 100 MG tablet Commonly known as:  VIAGRA Take 100 mg by mouth daily as needed for erectile dysfunction.   simvastatin 40 MG tablet Commonly known as:  ZOCOR Take 60 mg by mouth daily.   Vitamin D3 5000 units Tabs Take 5,000 Units by mouth daily  at 2 PM.      Follow-up Information    [facility]   Contact information: [address] [place] [address] [phone]        Darletta MollEOH,SUI W, MD Follow up in 2 week(s).   Specialty:  Otolaryngology Contact information: [address] [place] [address] [phone]          Allergies  Allergen Reactions  . Penicillins Anaphylaxis       . Demerol [Meperidine] Nausea Only     Procedures/Studies: 2 d ECHO  Study Conclusions  - Left ventricle: Poor image quality mid and basal inferior wall   appear hypokinetic. Wall thickness was increased in a pattern of  moderate LVH. Systolic function was mildly reduced. The estimated   ejection fraction was in the range of 45% to 50%. Left   ventricular diastolic function parameters were normal. - Aortic valve: There was trivial regurgitation. - Left atrium: The atrium was moderately dilated. - Right atrium: The atrium was mildly dilated. - Atrial septum: No defect or patent foramen ovale was identified. - Pulmonary arteries: PA peak pressure: 49 mm Hg (S).   Ct Soft Tissue Neck W Contrast  Result Date: 06/09/2016 CLINICAL DATA:  Difficulty breathing. Recently intubated. Pharyngitis. EXAM: CT NECK WITH CONTRAST TECHNIQUE: Multidetector CT imaging of the neck was performed using the standard protocol following the bolus administration of intravenous contrast. CONTRAST:  75 cc Isovue-300 COMPARISON:  06/05/2016 FINDINGS: Again, there is no intact airway demonstrated in the hypopharynx, supraglottic and glottic region. This must be due to voluntary closure of the glottis or else the patient would not be able to breathe. Allowing for that, the only abnormality I seen is a a hyperdensity along the right side of the supraglottic region apparently within the mucosa or submucosa. The nature of this is uncertain and it could represent a foreign object or a soft tissue  calcification. This was also present on the initial study. I do not see any enlarged or low-density lymph nodes. Parotid and submandibular glands are unremarkable. Vascular structures are patent. There is atherosclerotic disease at both carotid bifurcations trauma with previous endarterectomy on the right. No evidence of fluid collections in the neck. Lung apices are clear. Ordinary spondylosis is present. IMPRESSION: Again, there is no patent airway demonstrated in the hypopharynx, supraglottic region and glottis. This must be due to voluntary airway closure or the patient would not be able to breathe. I do not see any evidence of abscess or mass. One finding that I cannot explain is a dense structure just to the right of midline along the right side of the supraglottic region that could possibly be a foreign object or physiologic calcification. This was present on the initial study. Electronically Signed   By: Paulina Fusi M.D.   On: 06/09/2016 15:26   Ct Soft Tissue Neck W Contrast  Result Date: 06/05/2016 CLINICAL DATA:  Shortness of breath, globus sensation for 3 days. Neck pain and stridor. History of COPD. EXAM: CT NECK WITH CONTRAST TECHNIQUE: Multidetector CT imaging of the neck was performed using the standard protocol following the bolus administration of intravenous contrast. CONTRAST:  75mL ISOVUE-300 IOPAMIDOL (ISOVUE-300) INJECTION 61% COMPARISON:  None. FINDINGS: Pharynx and larynx: Severely enlarged adenoidal soft tissues, with fullness of Waldeyer's ring. Secretions in the hypopharynx. Mildly edematous epiglottis, apposition of the true vocal cords and false vocal cords. No radiopaque foreign bodies or focal fluid collection. Salivary glands: Symmetrically diminutive parotid glands with prominent accessory parotid tissue. No acute process. Thyroid: Normal. Lymph nodes: Vascular: Status post RIGHT carotid endarterectomy. Calcific atherosclerosis results and suspected severe stenosis LEFT  internal carotid artery origin. Severe calcific atherosclerosis of the aortic arch. Limited intracranial: Severe calcific atherosclerosis of the carotid siphons with suspected stenosis RIGHT supraclinoid internal carotid artery. Old small LEFT cerebellar infarct. Visualized orbits: Old LEFT medial orbital blowout fracture. Mastoids and visualized paranasal sinuses: Severe bilateral maxillary sinus soft tissue opacification with mucoperiosteal reaction. Mild lobulated ethmoid mucosal thickening. Skeleton: Status post median sternotomy. Multilevel severe/ moderate to severe degenerative discs and severe facet arthropathy. Severe LEFT C3-4, severe RIGHT and LEFT C4-5, moderate to severe C5-6 neural foraminal narrowing. Upper chest: Partially imaged centrilobular emphysema. Subcentimeter  mediastinal lymph nodes are likely reactive. Other: IMPRESSION: Severe suspected pharyngitis/laryngitis with apposition of the true vocal cords, which may be due to edema or phonation. Given degree of pharyngeal enlargement though unlikely, lymphoproliferative disease is a possibility. Severe chronic maxillary sinusitis. Severe intracranial atherosclerosis with suspected stenosis RIGHT supraclinoid internal carotid artery. Probable severe LEFT internal carotid artery stenosis. Acute findings discussed with and reconfirmed by Dr.STEPHEN KOHUT on 06/05/2016 at 4:10 pm. Electronically Signed   By: Awilda Metro M.D.   On: 06/05/2016 16:12   US Carotid Bilateral  Result Date: 06/06/2016 CLINICAL DATA:  70 year old male with a history of carotid disease. Cardiovascular risk factors include hypertension, known stroke/ TIA, known coronary disease, known carotid disease with prior vascular surgery/ carotid endarterectomy, tobacco exposure EXAM: BILATERAL CAROTID DUPLEX ULTRASOUND TECHNIQUE: Wallace Cullens scale imaging, color Doppler and duplex ultrasound were performed of bilateral carotid and vertebral arteries in the neck. COMPARISON:  CT  06/05/2016 FINDINGS: Criteria: Quantification of carotid stenosis is based on velocity parameters that correlate the residual internal carotid diameter with NASCET-based stenosis levels, using the diameter of the distal internal carotid lumen as the denominator for stenosis measurement. The following velocity measurements were obtained: RIGHT ICA:  Systolic 63 cm/sec, Diastolic 19 cm/sec CCA:  93 cm/sec SYSTOLIC ICA/CCA RATIO:  0.7 ECA:  118 cm/sec LEFT ICA:  Systolic 278 cm/sec, Diastolic 23 cm/sec CCA:  113 cm/sec SYSTOLIC ICA/CCA RATIO:  2.5 ECA:  95 cm/sec Right Brachial SBP: Not acquired Left Brachial SBP: Not acquired RIGHT CAROTID ARTERY: Given history of prior right carotid endarterectomy. Atherosclerotic changes of the right carotid system. Mild calcified plaque of the common carotid artery. Intermediate waveform maintained. Mild wall thickening at the right carotid bulb measures greater than 1 mm, though does not appear to result in any narrowing. Low resistance waveform of the right ICA. Mild tortuosity. RIGHT VERTEBRAL ARTERY: Antegrade flow with low resistance waveform. LEFT CAROTID ARTERY: Atherosclerotic changes with calcified plaque of the common carotid artery. Intermediate waveform maintained. Heterogeneous and calcified plaque at the carotid bifurcation. Luminal shadowing is present. High resistance systolic upstroke of the ICA waveform with reduced diastolic flow. LEFT VERTEBRAL ARTERY:  Antegrade flow with low resistance waveform. IMPRESSION: Right: Postsurgical history of right carotid endarterectomy. Note that the application of duplex velocity criteria has not been validated in the postoperative patient, however, there is evidence of minimal intimal hyperplasia without evidence of significant recurrent stenosis. Left: Heterogeneous and densely calcified plaque at the carotid bifurcation, with discordant results regarding degree of stenosis by established duplex criteria. Peak velocity  suggests 70% - 99% stenosis, with the ICA/ CCA ratio suggesting a lesser degree of stenosis. Note that the flow velocities of the left ICA were obtained from an area distal to the maximum narrowing due to the presence of anterior wall plaque with shadowing and may be underestimating the percentage of ICA stenosis. Also, the ICA waveform is suggestive of intracranial stenosis, in addition to the cervical stenosis. If a more precise assessment of left cervical ICA and intracranial ICA stenosis is warranted, a formal cerebral angiogram may be considered. Signed, Yvone Neu. Loreta Ave, DO [facility] Electronically Signed   By: Gilmer Mor D.O.   On: 06/06/2016 14:08   Dg Chest Port 1 View  Result Date: 06/07/2016 CLINICAL DATA:  Ventilator support.  Followup. EXAM: PORTABLE CHEST 1 VIEW COMPARISON:  06/06/2016 FINDINGS: Previous median sternotomy. Endotracheal tube tip 4.5 cm above the carina. Nasogastric tube enters the stomach. Mild interstitial pulmonary  prominence persists which could be chronic or mild interstitial edema. Volume loss in the lower lobes left worse than right persist which could be atelectasis or atelectatic pneumonia. Upper lungs are clear. No effusions. IMPRESSION: No change. Interstitial prominence which could be chronic scarring or mild interstitial edema. Lower lobe density left worsened right that could be atelectasis or mild pneumonia. Electronically Signed   By: Paulina Fusi M.D.   On: 06/07/2016 07:13   Dg Chest Port 1 View  Result Date: 06/06/2016 CLINICAL DATA:  Endotracheal tube exchange.  The OG tube placement. EXAM: PORTABLE CHEST 1 VIEW COMPARISON:  06/06/2016 FINDINGS: There is an endotracheal tube with the tip 2 cm above the carina. There is a nasogastric tube coursing below the diaphragm. There is mild bilateral interstitial thickening. There is no focal parenchymal opacity. There is no pleural effusion or  pneumothorax. The heart and mediastinal contours are unremarkable. There is evidence of prior CABG. The osseous structures are unremarkable. IMPRESSION: Endotracheal tube with the tip 2 cm above the carina. Nasogastric tube coursing below the diaphragm. Electronically Signed   By: Elige Ko   On: 06/06/2016 13:46   Dg Chest Port 1 View  Result Date: 06/06/2016 CLINICAL DATA:  Intubation. EXAM: PORTABLE CHEST 1 VIEW COMPARISON:  06/05/2016. FINDINGS: Endotracheal tube tip not well identified, and may be in the cervical trachea. NG tube noted with tip below left hemidiaphragm. Bibasilar atelectasis. Heart size normal. Prior CABG. No pleural effusion or pneumothorax. IMPRESSION: 1. Endotracheal tube tip not well identified and may be in the cervical trachea. NG tube noted with tip below left hemidiaphragm. 2. Bibasilar atelectasis. Critical Value/emergent results were called by telephone at the time of interpretation on 06/06/2016 at 7:33 am to nurse Verdon Cummins, who verbally acknowledged these results an was aware of these findings. Electronically Signed   By: Maisie Fus  Register   On: 06/06/2016 07:34   Dg Chest Portable 1 View  Result Date: 06/05/2016 CLINICAL DATA:  Shortness of breath, COPD, hypertension, smoker, prior CABG, intubation EXAM: PORTABLE CHEST 1 VIEW COMPARISON:  Portable exam 1627 hours without priors for comparison FINDINGS: Nasogastric tube extends into stomach. No endotracheal tube identified. Normal heart size post median sternotomy. Mediastinal contours and pulmonary vascularity normal. Atherosclerotic calcification aorta. Bronchitic changes with bibasilar opacities which could represent atelectasis or infiltrate, greater on LEFT. Upper lungs clear. No pleural effusion or pneumothorax. IMPRESSION: Bibasilar atelectasis versus infiltrate LEFT greater than RIGHT. Aortic atherosclerosis. No endotracheal tube identified. Findings discussed with Dr. Juleen China on 06/05/2016 at 1650 hours.  Electronically Signed   By: Ulyses Southward M.D.   On: 06/05/2016 16:51       Discharge Exam: Vitals:   06/10/16 0641 06/10/16 0810  BP: 139/66 (!) 150/71  Pulse: 72 85  Resp: 18 20  Temp: 97.9 F (36.6 C) 98.5 F (36.9 C)   Vitals:   06/09/16 2033 06/09/16 2048 06/10/16 0641 06/10/16 0810  BP:  (!) 139/59 139/66 (!) 150/71  Pulse: 72 73 72 85  Resp: 19 20 18 20   Temp:  97.7 F (36.5 C) 97.9 F (36.6 C) 98.5 F (36.9 C)  TempSrc:  Oral Oral Oral  SpO2: 98% 98% 99% 97%  Weight:      Height:        General: Pt is alert, awake, not in acute distress Cardiovascular: RRR, S1/S2 +, no rubs, no gallops Respiratory: CTA bilaterally, no wheezing, no rhonchi Abdominal: Soft, NT, ND, bowel sounds + Extremities: no edema, no cyanosis  The results of significant diagnostics from this hospitalization (including imaging, microbiology, ancillary and laboratory) are listed below for reference.     Microbiology: Recent Results (from the past 240 hour(s))  MRSA PCR Screening     Status: None   Collection Time: 06/05/16  8:21 PM  Result Value Ref Range Status   MRSA by PCR NEGATIVE NEGATIVE Final    Comment:        The GeneXpert MRSA Assay (FDA approved for NASAL specimens only), is one component of a comprehensive MRSA colonization surveillance program. It is not intended to diagnose MRSA infection nor to guide or monitor treatment for MRSA infections.   Respiratory Panel by PCR     Status: Abnormal   Collection Time: 06/06/16  1:18 PM  Result Value Ref Range Status   Adenovirus NOT DETECTED NOT DETECTED Final   Coronavirus 229E NOT DETECTED NOT DETECTED Final   Coronavirus HKU1 NOT DETECTED NOT DETECTED Final   Coronavirus NL63 NOT DETECTED NOT DETECTED Final   Coronavirus OC43 NOT DETECTED NOT DETECTED Final   Metapneumovirus NOT DETECTED NOT DETECTED Final   Rhinovirus / Enterovirus DETECTED (A) NOT DETECTED Final   Influenza A NOT DETECTED NOT DETECTED Final    Influenza B NOT DETECTED NOT DETECTED Final   Parainfluenza Virus 1 NOT DETECTED NOT DETECTED Final   Parainfluenza Virus 2 NOT DETECTED NOT DETECTED Final   Parainfluenza Virus 3 NOT DETECTED NOT DETECTED Final   Parainfluenza Virus 4 NOT DETECTED NOT DETECTED Final   Respiratory Syncytial Virus NOT DETECTED NOT DETECTED Final   Bordetella pertussis NOT DETECTED NOT DETECTED Final   Chlamydophila pneumoniae NOT DETECTED NOT DETECTED Final   Mycoplasma pneumoniae NOT DETECTED NOT DETECTED Final  Culture, Urine     Status: None   Collection Time: 06/06/16  1:41 PM  Result Value Ref Range Status   Specimen Description URINE, CATHETERIZED  Final   Special Requests NONE  Final   Culture NO GROWTH  Final   Report Status 06/07/2016 FINAL  Final  Culture, blood (routine x 2)     Status: None   Collection Time: 06/06/16  1:58 PM  Result Value Ref Range Status   Specimen Description BLOOD RIGHT HAND  Final   Special Requests IN PEDIATRIC BOTTLE 1CC  Final   Culture NO GROWTH 5 DAYS  Final   Report Status 06/11/2016 FINAL  Final  Culture, blood (routine x 2)     Status: None   Collection Time: 06/06/16  2:08 PM  Result Value Ref Range Status   Specimen Description BLOOD RIGHT HAND  Final   Special Requests IN PEDIATRIC BOTTLE 1CC  Final   Culture NO GROWTH 5 DAYS  Final   Report Status 06/11/2016 FINAL  Final  Culture, respiratory (NON-Expectorated)     Status: None   Collection Time: 06/06/16  5:52 PM  Result Value Ref Range Status   Specimen Description TRACHEAL ASPIRATE  Final   Special Requests NONE  Final   Gram Stain   Final    FEW WBC PRESENT, PREDOMINANTLY PMN ABUNDANT GRAM POSITIVE DIPLOCOCCI FEW YEAST RARE SQUAMOUS EPITHELIAL CELLS PRESENT    Culture   Final    MODERATE DIPHTHEROIDS(CORYNEBACTERIUM SPECIES) Standardized susceptibility testing for this organism is not available. MODERATE CANDIDA ALBICANS    Report Status 06/10/2016 FINAL  Final     Labs: BNP (last 3  results) No results for input(s): BNP in the last 8760 hours. Basic Metabolic Panel:  Recent Labs Lab 06/05/16 1352  06/05/16 1749 06/06/16 0416 06/06/16 0500 06/06/16 1448 06/07/16 0442 06/08/16 0355 06/09/16 0235  NA 135  --   --  136  --  138 133* 136  K 2.9*  --   --  3.8  --  4.2 4.7 4.7  CL 94*  --   --  105  --  107 104 101  CO2 29  --   --  22  --  26 24 27   GLUCOSE 90  --   --  135*  --  145* 122* 130*  BUN 25*  --   --  28*  --  28* 32* 22*  CREATININE 1.01  --   --  0.94  --  0.83 0.81 0.73  CALCIUM 7.0*  --   --  6.2*  --  6.8* 7.8* 8.6*  MG  --  0.5* 3.2*  --  3.0* 2.5*  --  1.9  PHOS  --  4.0  --   --  4.3 3.1  --  2.1*   Liver Function Tests:  Recent Labs Lab 06/06/16 0500 06/07/16 0442 06/08/16 0355  AST 19 20 44*  ALT 16* 16* 32  ALKPHOS 39 38 61  BILITOT 0.4 0.3 0.8  PROT 5.6* 5.6* 6.5  ALBUMIN 2.7* 2.5* 3.0*   No results for input(s): LIPASE, AMYLASE in the last 168 hours. No results for input(s): AMMONIA in the last 168 hours. CBC:  Recent Labs Lab 06/05/16 1352 06/06/16 0416 06/07/16 0442 06/09/16 0235 06/10/16 0217  WBC 29.2* 16.3* 27.2* 25.2* 18.9*  NEUTROABS 21.9* 14.7* 25.6*  --   --   HGB 12.3* 11.2* 11.1* 11.8* 11.2*  HCT 37.1* 32.1* 32.8* 34.9* 32.7*  MCV 86.3 86.1 85.2 84.5 84.3  PLT 520* 380 380 385 327   Cardiac Enzymes: No results for input(s): CKTOTAL, CKMB, CKMBINDEX, TROPONINI in the last 168 hours. BNP: Invalid input(s): POCBNP CBG:  Recent Labs Lab 06/06/16 1936 06/07/16 0029 06/07/16 0318 06/07/16 0816 06/07/16 1218  GLUCAP 152* 136* 145* 147* 132*   D-Dimer No results for input(s): DDIMER in the last 72 hours. Hgb A1c No results for input(s): HGBA1C in the last 72 hours. Lipid Profile No results for input(s): CHOL, HDL, LDLCALC, TRIG, CHOLHDL, LDLDIRECT in the last 72 hours. Thyroid function studies No results for input(s): TSH, T4TOTAL, T3FREE, THYROIDAB in the last 72 hours.  Invalid input(s):  FREET3 Anemia work up No results for input(s): VITAMINB12, FOLATE, FERRITIN, TIBC, IRON, RETICCTPCT in the last 72 hours. Urinalysis No results found for: COLORURINE, APPEARANCEUR, LABSPEC, PHURINE, GLUCOSEU, HGBUR, BILIRUBINUR, KETONESUR, PROTEINUR, UROBILINOGEN, NITRITE, LEUKOCYTESUR Sepsis Labs Invalid input(s): PROCALCITONIN,  WBC,  LACTICIDVEN Microbiology Recent Results (from the past 240 hour(s))  MRSA PCR Screening     Status: None   Collection Time: 06/05/16  8:21 PM  Result Value Ref Range Status   MRSA by PCR NEGATIVE NEGATIVE Final    Comment:        The GeneXpert MRSA Assay (FDA approved for NASAL specimens only), is one component of a comprehensive MRSA colonization surveillance program. It is not intended to diagnose MRSA infection nor to guide or monitor treatment for MRSA infections.   Respiratory Panel by PCR     Status: Abnormal   Collection Time: 06/06/16  1:18 PM  Result Value Ref Range Status   Adenovirus NOT DETECTED NOT DETECTED Final   Coronavirus 229E NOT DETECTED NOT DETECTED Final   Coronavirus HKU1 NOT DETECTED NOT DETECTED Final   Coronavirus NL63 NOT  DETECTED NOT DETECTED Final   Coronavirus OC43 NOT DETECTED NOT DETECTED Final   Metapneumovirus NOT DETECTED NOT DETECTED Final   Rhinovirus / Enterovirus DETECTED (A) NOT DETECTED Final   Influenza A NOT DETECTED NOT DETECTED Final   Influenza B NOT DETECTED NOT DETECTED Final   Parainfluenza Virus 1 NOT DETECTED NOT DETECTED Final   Parainfluenza Virus 2 NOT DETECTED NOT DETECTED Final   Parainfluenza Virus 3 NOT DETECTED NOT DETECTED Final   Parainfluenza Virus 4 NOT DETECTED NOT DETECTED Final   Respiratory Syncytial Virus NOT DETECTED NOT DETECTED Final   Bordetella pertussis NOT DETECTED NOT DETECTED Final   Chlamydophila pneumoniae NOT DETECTED NOT DETECTED Final   Mycoplasma pneumoniae NOT DETECTED NOT DETECTED Final  Culture, Urine     Status: None   Collection Time: 06/06/16  1:41 PM   Result Value Ref Range Status   Specimen Description URINE, CATHETERIZED  Final   Special Requests NONE  Final   Culture NO GROWTH  Final   Report Status 06/07/2016 FINAL  Final  Culture, blood (routine x 2)     Status: None   Collection Time: 06/06/16  1:58 PM  Result Value Ref Range Status   Specimen Description BLOOD RIGHT HAND  Final   Special Requests IN PEDIATRIC BOTTLE 1CC  Final   Culture NO GROWTH 5 DAYS  Final   Report Status 06/11/2016 FINAL  Final  Culture, blood (routine x 2)     Status: None   Collection Time: 06/06/16  2:08 PM  Result Value Ref Range Status   Specimen Description BLOOD RIGHT HAND  Final   Special Requests IN PEDIATRIC BOTTLE 1CC  Final   Culture NO GROWTH 5 DAYS  Final   Report Status 06/11/2016 FINAL  Final  Culture, respiratory (NON-Expectorated)     Status: None   Collection Time: 06/06/16  5:52 PM  Result Value Ref Range Status   Specimen Description TRACHEAL ASPIRATE  Final   Special Requests NONE  Final   Gram Stain   Final    FEW WBC PRESENT, PREDOMINANTLY PMN ABUNDANT GRAM POSITIVE DIPLOCOCCI FEW YEAST RARE SQUAMOUS EPITHELIAL CELLS PRESENT    Culture   Final    MODERATE DIPHTHEROIDS(CORYNEBACTERIUM SPECIES) Standardized susceptibility testing for this organism is not available. MODERATE CANDIDA ALBICANS    Report Status 06/10/2016 FINAL  Final     Time coordinating discharge: Over 30 minutes  SIGNED:   Calvert CantorIZWAN,Aileana Hodder, MD  [facility] 06/12/2016, 11:18 AM Pager   If 7PM-7AM, please contact night-coverage [email or web address] Password [email or web address]

## 2016-06-10 NOTE — Progress Notes (Addendum)
Patient in a stable condition, discharge education completed with patient he verbalized understanding, patient belongings at bedside,iv removed, tele dc, ccmd notified, patient taken off the unit on a wheelchair by this RN

## 2016-06-11 LAB — CULTURE, BLOOD (ROUTINE X 2)
CULTURE: NO GROWTH
Culture: NO GROWTH

## 2016-07-02 ENCOUNTER — Ambulatory Visit (INDEPENDENT_AMBULATORY_CARE_PROVIDER_SITE_OTHER): Payer: Medicare Other | Admitting: Otolaryngology

## 2016-07-05 ENCOUNTER — Observation Stay (HOSPITAL_COMMUNITY)
Admission: EM | Admit: 2016-07-05 | Discharge: 2016-07-06 | Disposition: A | Discharge status: Home / Self Care | Payer: Medicare Other | Attending: Internal Medicine | Admitting: Internal Medicine

## 2016-07-05 ENCOUNTER — Emergency Department (HOSPITAL_COMMUNITY): Payer: Medicare Other

## 2016-07-05 ENCOUNTER — Encounter (HOSPITAL_COMMUNITY): Payer: Self-pay | Admitting: *Deleted

## 2016-07-05 DIAGNOSIS — I10 Essential (primary) hypertension: Secondary | ICD-10-CM | POA: Insufficient documentation

## 2016-07-05 DIAGNOSIS — R061 Stridor: Secondary | ICD-10-CM | POA: Diagnosis not present

## 2016-07-05 DIAGNOSIS — Z5321 Procedure and treatment not carried out due to patient leaving prior to being seen by health care provider: Secondary | ICD-10-CM | POA: Diagnosis not present

## 2016-07-05 DIAGNOSIS — R0603 Acute respiratory distress: Secondary | ICD-10-CM | POA: Diagnosis not present

## 2016-07-05 DIAGNOSIS — E785 Hyperlipidemia, unspecified: Secondary | ICD-10-CM | POA: Diagnosis not present

## 2016-07-05 DIAGNOSIS — F17210 Nicotine dependence, cigarettes, uncomplicated: Secondary | ICD-10-CM | POA: Insufficient documentation

## 2016-07-05 DIAGNOSIS — K219 Gastro-esophageal reflux disease without esophagitis: Secondary | ICD-10-CM | POA: Insufficient documentation

## 2016-07-05 DIAGNOSIS — Z880 Allergy status to penicillin: Secondary | ICD-10-CM | POA: Insufficient documentation

## 2016-07-05 DIAGNOSIS — Z885 Allergy status to narcotic agent status: Secondary | ICD-10-CM | POA: Insufficient documentation

## 2016-07-05 DIAGNOSIS — J385 Laryngeal spasm: Secondary | ICD-10-CM | POA: Diagnosis not present

## 2016-07-05 DIAGNOSIS — F17200 Nicotine dependence, unspecified, uncomplicated: Secondary | ICD-10-CM | POA: Diagnosis not present

## 2016-07-05 DIAGNOSIS — J029 Acute pharyngitis, unspecified: Secondary | ICD-10-CM | POA: Diagnosis not present

## 2016-07-05 DIAGNOSIS — J449 Chronic obstructive pulmonary disease, unspecified: Secondary | ICD-10-CM | POA: Insufficient documentation

## 2016-07-05 LAB — CBC WITH DIFFERENTIAL/PLATELET
BASOS ABS: 0 10*3/uL (ref 0.0–0.1)
Basophils Relative: 1 %
Eosinophils Absolute: 0.4 10*3/uL (ref 0.0–0.7)
Eosinophils Relative: 5 %
HEMATOCRIT: 39.5 % (ref 39.0–52.0)
HEMOGLOBIN: 13.5 g/dL (ref 13.0–17.0)
LYMPHS PCT: 30 %
Lymphs Abs: 2.5 10*3/uL (ref 0.7–4.0)
MCH: 30 pg (ref 26.0–34.0)
MCHC: 34.2 g/dL (ref 30.0–36.0)
MCV: 87.8 fL (ref 78.0–100.0)
MONO ABS: 1.4 10*3/uL — AB (ref 0.1–1.0)
Monocytes Relative: 17 %
NEUTROS PCT: 47 %
Neutro Abs: 3.9 10*3/uL (ref 1.7–7.7)
Platelets: 417 10*3/uL — ABNORMAL HIGH (ref 150–400)
RBC: 4.5 MIL/uL (ref 4.22–5.81)
RDW: 14.7 % (ref 11.5–15.5)
WBC: 8.2 10*3/uL (ref 4.0–10.5)

## 2016-07-05 LAB — I-STAT CHEM 8, ED
BUN: 7 mg/dL (ref 6–20)
CREATININE: 1 mg/dL (ref 0.61–1.24)
Calcium, Ion: 1.19 mmol/L (ref 1.15–1.40)
Chloride: 97 mmol/L — ABNORMAL LOW (ref 101–111)
Glucose, Bld: 111 mg/dL — ABNORMAL HIGH (ref 65–99)
HEMATOCRIT: 42 % (ref 39.0–52.0)
HEMOGLOBIN: 14.3 g/dL (ref 13.0–17.0)
Potassium: 4.4 mmol/L (ref 3.5–5.1)
Sodium: 133 mmol/L — ABNORMAL LOW (ref 135–145)
TCO2: 28 mmol/L (ref 0–100)

## 2016-07-05 MED ORDER — METHYLPREDNISOLONE SODIUM SUCC 125 MG IJ SOLR
125.0000 mg | Freq: Once | INTRAMUSCULAR | Status: AC
  Administered 2016-07-05: 125 mg via INTRAVENOUS

## 2016-07-05 MED ORDER — DIPHENHYDRAMINE HCL 50 MG/ML IJ SOLN
50.0000 mg | Freq: Once | INTRAMUSCULAR | Status: AC
  Administered 2016-07-05: 50 mg via INTRAVENOUS

## 2016-07-05 MED ORDER — FAMOTIDINE IN NACL 20-0.9 MG/50ML-% IV SOLN
INTRAVENOUS | Status: AC
  Filled 2016-07-05: qty 50

## 2016-07-05 MED ORDER — DIPHENHYDRAMINE HCL 50 MG/ML IJ SOLN
INTRAMUSCULAR | Status: AC
  Filled 2016-07-05: qty 1

## 2016-07-05 MED ORDER — FAMOTIDINE IN NACL 20-0.9 MG/50ML-% IV SOLN
20.0000 mg | Freq: Once | INTRAVENOUS | Status: AC
  Administered 2016-07-05: 20 mg via INTRAVENOUS

## 2016-07-05 MED ORDER — METHYLPREDNISOLONE SODIUM SUCC 125 MG IJ SOLR
INTRAMUSCULAR | Status: AC
  Filled 2016-07-05: qty 2

## 2016-07-05 MED ORDER — RACEPINEPHRINE HCL 2.25 % IN NEBU
INHALATION_SOLUTION | RESPIRATORY_TRACT | Status: AC
  Administered 2016-07-05: 0.5 mL via RESPIRATORY_TRACT
  Filled 2016-07-05: qty 1

## 2016-07-05 MED ORDER — POVIDONE-IODINE 10 % EX SOLN
CUTANEOUS | Status: AC
  Filled 2016-07-05: qty 118

## 2016-07-05 MED ORDER — EPINEPHRINE 0.3 MG/0.3ML IJ SOAJ
0.3000 mg | Freq: Once | INTRAMUSCULAR | Status: AC
  Administered 2016-07-05: 0.3 mg via INTRAMUSCULAR
  Filled 2016-07-05: qty 0.3

## 2016-07-05 MED ORDER — RACEPINEPHRINE HCL 2.25 % IN NEBU
0.5000 mL | INHALATION_SOLUTION | Freq: Once | RESPIRATORY_TRACT | Status: AC
  Administered 2016-07-05: 0.5 mL via RESPIRATORY_TRACT

## 2016-07-05 NOTE — H&P (Signed)
History and Physical    Clarence Bradshaw ZOX:[id number] DOB: 07-Mar-1946 DOA: 07/05/2016  PCP: [facility]  Patient coming from: Home.    Chief Complaint:   Difficult breathing.   HPI: Clarence Bradshaw is an 71 y.o. male with hx of upper respiratory infection, with cord edema mid Dec 2017 requiring intubation, hx of COPD, active tobacco abuse, HTN on Lisinopril, returned to the ER with stridor and respiratory distress.  Last admission, [facility], and Dr Suszanne Conners recommeded finishing clindamycin and follow up outpatient, but he has breathing difficulty tonight.  CT scan done last admission showed some calcification, foreign body not excluded.  He denied fever, chills, sorethroat this time.   He was discharged on Lisinopril last time along with antibiotics.  In the ER, he was found to have no leukocytosis, and no fever.  Soft tissue film showed mild subglotic fullness is stable, and normal epiglotis.  He was given IV steroids, IV pepcid, racemic epi nebs, and SQ epinephrine, and he improved.  Hospitalist was asked to admit him for further evaluation.   ED Course:  See above.  Rewiew of Systems:  Constitutional: Negative for malaise, fever and chills. No significant weight loss or weight gain Eyes: Negative for eye pain, redness and discharge, diplopia, visual changes, or flashes of light. ENMT: Negative for ear pain, hoarseness, nasal congestion, sinus pressure and sore throat. No headaches; tinnitus, drooling, or problem swallowing. Cardiovascular: Negative for chest pain, palpitations, diaphoresis,  and peripheral edema. ; No orthopnea, PND Respiratory: Negative for cough, hemoptysis, wheezing and stridor. No pleuritic chestpain. Gastrointestinal: Negative for diarrhea, constipation,  melena, blood in stool, hematemesis, jaundice and rectal bleeding.    Genitourinary: Negative for frequency, dysuria, incontinence,flank pain and  hematuria; Musculoskeletal: Negative for back pain and neck pain. Negative for swelling and trauma.;  Skin: . Negative for pruritus, rash, abrasions, bruising and skin lesion.; ulcerations Neuro: Negative for headache, lightheadedness and neck stiffness. Negative for weakness, altered level of consciousness , altered mental status, extremity weakness, burning feet, involuntary movement, seizure and syncope.  Psych: negative for anxiety, depression, insomnia, tearfulness, panic attacks, hallucinations, paranoia, suicidal or homicidal ideation    Past Medical History:  Diagnosis Date  . COPD (chronic obstructive pulmonary disease) (HCC)   . Hypertension     Past Surgical History:  Procedure Laterality Date  . BACK SURGERY    . CARDIAC SURGERY    . CORONARY ARTERY BYPASS GRAFT     x3  . PERCUTANEOUS PLACEMENT INTRAVASCULAR STENT CERVICAL CAROTID ARTERY       reports that he has been smoking Cigarettes.  He has been smoking about 1.00 pack per day. He has quit using smokeless tobacco. He reports that he does not drink alcohol. His drug history is not on file.  Allergies  Allergen Reactions  . Penicillins Anaphylaxis       . Demerol [Meperidine] Nausea Only    No family history on file.   Prior to Admission medications   Medication Sig Start Date End Date Taking? Authorizing Provider  acetaminophen (TYLENOL) 500 MG tablet Take 500 mg by mouth every 4 (four) hours as needed for moderate pain or headache.   Yes Historical Provider, MD  albuterol (PROVENTIL HFA;VENTOLIN HFA) 108 (90 Base) MCG/ACT inhaler Inhale 1-2 puffs into the lungs every 6 (six) hours as needed for wheezing or shortness of breath.   Yes Historical Provider, MD  amLODipine (NORVASC) 10 MG tablet Take 10 mg  by mouth daily.   Yes Historical Provider, MD  atorvastatin (LIPITOR) 20 MG tablet Take 1 tablet (20 mg total) by mouth daily at 6 PM. 06/10/16  Yes Calvert CantorSaima Rizwan, MD  Calcium Carbonate 500 MG CHEW Chew 500 mg by  mouth daily.   Yes Historical Provider, MD  carvedilol (COREG) 25 MG tablet Take 25 mg by mouth 2 (two) times daily with a meal.   Yes Historical Provider, MD  Cholecalciferol (VITAMIN D3) 5000 units TABS Take 5,000 Units by mouth daily at 2 PM.    Yes Historical Provider, MD  fluticasone (FLONASE) 50 MCG/ACT nasal spray Place 2 sprays into both nostrils daily.   Yes Historical Provider, MD  ibuprofen (ADVIL,MOTRIN) 600 MG tablet Take 600 mg by mouth 2 (two) times daily as needed for headache or moderate pain.   Yes Historical Provider, MD  lisinopril (PRINIVIL,ZESTRIL) 5 MG tablet Take 1 tablet (5 mg total) by mouth daily. 06/11/16  Yes Calvert CantorSaima Rizwan, MD  magnesium oxide (MAG-OX) 400 MG tablet Take 400 mg by mouth daily.   Yes Historical Provider, MD  nortriptyline (PAMELOR) 25 MG capsule Take 25 mg by mouth at bedtime.   Yes Historical Provider, MD  omeprazole (PRILOSEC) 20 MG capsule Take 20 mg by mouth 2 (two) times daily before a meal.   Yes Historical Provider, MD  sildenafil (VIAGRA) 100 MG tablet Take 100 mg by mouth daily as needed for erectile dysfunction.   Yes Historical Provider, MD  traMADol (ULTRAM) 50 MG tablet Take 50 mg by mouth 4 (four) times daily as needed. 06/28/16  Yes Historical Provider, MD  clindamycin (CLEOCIN) 300 MG capsule Take 1 capsule (300 mg total) by mouth every 6 (six) hours. Patient not taking: Reported on 07/05/2016 06/10/16   Calvert CantorSaima Rizwan, MD  naproxen (NAPROSYN) 500 MG tablet Take 1 tablet (500 mg total) by mouth 2 (two) times daily as needed. Patient not taking: Reported on 07/05/2016 06/10/16   Calvert CantorSaima Rizwan, MD  predniSONE (STERAPRED UNI-PAK 21 TAB) 10 MG (21) TBPK tablet Take 1 tablet (10 mg total) by mouth daily. Taper by 10 mg daily until finished Patient not taking: Reported on 07/05/2016 06/10/16   Calvert CantorSaima Rizwan, MD    Physical Exam: Vitals:   07/05/16 1708 07/05/16 1730 07/05/16 1800 07/05/16 1830  BP:  132/62 (!) 148/106 126/67  Pulse:  112 99 109   Resp:  15 11 15   SpO2: 100% 100% 100% 96%      Constitutional: NAD, calm, comfortable Vitals:   07/05/16 1708 07/05/16 1730 07/05/16 1800 07/05/16 1830  BP:  132/62 (!) 148/106 126/67  Pulse:  112 99 109  Resp:  15 11 15   SpO2: 100% 100% 100% 96%   Eyes: PERRL, lids and conjunctivae normal ENMT: Mucous membranes are moist. Posterior pharynx clear of any exudate or lesions.Normal dentition.  Neck: normal, supple, no masses, no thyromegaly Respiratory: clear to auscultation bilaterally, no wheezing, no crackles. Normal respiratory effort. No accessory muscle use.  Cardiovascular: Regular rate and rhythm, no murmurs / rubs / gallops. No extremity edema. 2+ pedal pulses. No carotid bruits.  Abdomen: no tenderness, no masses palpated. No hepatosplenomegaly. Bowel sounds positive.  Musculoskeletal: no clubbing / cyanosis. No joint deformity upper and lower extremities. Good ROM, no contractures. Normal muscle tone.  Skin: no rashes, lesions, ulcers. No induration Neurologic: CN 2-12 grossly intact. Sensation intact, DTR normal. Strength 5/5 in all 4.  Psychiatric: Normal judgment and insight. Alert and oriented x 3. Normal mood.  Labs on Admission: I have personally reviewed following labs and imaging studies  CBC:  Recent Labs Lab 07/05/16 1711 07/05/16 1714  WBC 8.2  --   NEUTROABS 3.9  --   HGB 13.5 14.3  HCT 39.5 42.0  MCV 87.8  --   PLT 417*  --    Basic Metabolic Panel:  Recent Labs Lab 07/05/16 1714  NA 133*  K 4.4  CL 97*  GLUCOSE 111*  BUN 7  CREATININE 1.00     Radiological Exams on Admission: Dg Neck Soft Tissue  Result Date: 07/05/2016 CLINICAL DATA:  Sore throat. EXAM: NECK SOFT TISSUES - 1+ VIEW COMPARISON:  Neck CT 06/09/2016 FINDINGS: The epiglottis and area epiglottic folds are normal. Mild subglottic fullness and calcifications are noted. Stable surgical changes involving the right neck and stable carotid artery calcifications. The bony  structures are stable. Advanced degenerative cervical spondylosis. The lung apices are clear. IMPRESSION: Mild subglottic fullness is stable. The epiglottis is normal. Electronically Signed   By: Rudie Meyer M.D.   On: 07/05/2016 18:22    EKG: Independently reviewed.   Assessment/Plan Principal Problem:   Laryngeal spasm Active Problems:   COPD (chronic obstructive pulmonary disease) (HCC)   Tobacco use disorder   Stridor    PLAN:   Stridor:  Suspicious for laryngeal spasm (severe).  Will continue with IV steroids, racemic Epi neb Tx, and IV Pepcid.  D/C Lisinopril indefinitely.   I spoke with Dr Suszanne Conners, who will see him tomorrow, in consideration for direct laryngoscopy.  There is no evidence of infection, so no antibiotic at this time.  He just finished a course of Clindamycin anyway.   COPD:  Stable.  Advise stop smoking.   GERD:  Continue with PPI.     DVT prophylaxis: SCD.  Code Status: FULL CODE.  Family Communication: Friend at bedside.  Disposition Plan: Home when stable.  Consults called: Dr Suszanne Conners of ENT.  Admission status: OBS.    Kathyleen Radice MD FACP. [facility]  If 7PM-7AM, please contact night-coverage [email or web address] Password [email or web address]  07/05/2016, 7:50 PM

## 2016-07-05 NOTE — ED Triage Notes (Signed)
Pt comes in with oral swelling. EDP Miller at beside. Pt previously intubated on 12/12 for the same thing.

## 2016-07-05 NOTE — ED Provider Notes (Signed)
[facility]-EMERGENCY DEPT Provider Note   CSN: [id number] Arrival date & time: 07/05/16  1648     History   Chief Complaint Chief Complaint  Patient presents with  . Oral Swelling    HPI Omar PersonRichard Osmundson is a 71 y.o. male.  HPI   71 y/o male with hx of COPd, Htn, on lisinopril - was recently intubated on 06/05/16, and was seen by ENT - extubated and sent home fter he improved.  There has been recurrent SOB, Dyspnea and difficulty speaking / breahting over the week and especially today after being exposed to smoking / cigarettes at a neighbors house - presents in acute distress with stridor - acutely ill in resp distress  Past Medical History:  Diagnosis Date  . COPD (chronic obstructive pulmonary disease) (HCC)   . Hypertension     Patient Active Problem List   Diagnosis Date Noted  . Acute respiratory failure with hypoxia (HCC)   . Acute pharyngitis 06/05/2016  . Hypomagnesemia 06/05/2016  . Hypokalemia 06/05/2016  . On mechanically assisted ventilation (HCC) 06/05/2016  . COPD (chronic obstructive pulmonary disease) (HCC) 06/05/2016  . Tobacco use disorder 06/05/2016  . Carotid artery stenosis 06/05/2016    Past Surgical History:  Procedure Laterality Date  . BACK SURGERY    . CARDIAC SURGERY    . CORONARY ARTERY BYPASS GRAFT     x3  . PERCUTANEOUS PLACEMENT INTRAVASCULAR STENT CERVICAL CAROTID ARTERY         Home Medications    Prior to Admission medications   Medication Sig Start Date End Date Taking? Authorizing Provider  acetaminophen (TYLENOL) 500 MG tablet Take 500 mg by mouth every 4 (four) hours as needed for moderate pain or headache.    Historical Provider, MD  albuterol (PROVENTIL HFA;VENTOLIN HFA) 108 (90 Base) MCG/ACT inhaler Inhale 1-2 puffs into the lungs every 6 (six) hours as needed for wheezing or shortness of breath.    Historical Provider, MD  amLODipine (NORVASC) 10 MG tablet Take 10 mg by mouth daily.    Historical Provider, MD  atorvastatin  (LIPITOR) 20 MG tablet Take 1 tablet (20 mg total) by mouth daily at 6 PM. 06/10/16   Calvert CantorSaima Rizwan, MD  Calcium Carbonate 500 MG CHEW Chew 500 mg by mouth daily.    Historical Provider, MD  carvedilol (COREG) 25 MG tablet Take 25 mg by mouth 2 (two) times daily with a meal.    Historical Provider, MD  Cholecalciferol (VITAMIN D3) 5000 units TABS Take 5,000 Units by mouth daily at 2 PM.     Historical Provider, MD  clindamycin (CLEOCIN) 300 MG capsule Take 1 capsule (300 mg total) by mouth every 6 (six) hours. 06/10/16   Calvert CantorSaima Rizwan, MD  fluticasone (FLONASE) 50 MCG/ACT nasal spray Place 2 sprays into both nostrils daily.    Historical Provider, MD  ibuprofen (ADVIL,MOTRIN) 600 MG tablet Take 600 mg by mouth 2 (two) times daily as needed for headache or moderate pain.    Historical Provider, MD  lisinopril (PRINIVIL,ZESTRIL) 5 MG tablet Take 1 tablet (5 mg total) by mouth daily. 06/11/16   Calvert CantorSaima Rizwan, MD  magnesium oxide (MAG-OX) 400 MG tablet Take 400 mg by mouth daily.    Historical Provider, MD  naproxen (NAPROSYN) 500 MG tablet Take 1 tablet (500 mg total) by mouth 2 (two) times daily as needed. 06/10/16   Calvert CantorSaima Rizwan, MD  nortriptyline (PAMELOR) 25 MG capsule Take 25 mg by mouth at bedtime.    Historical Provider, MD  omeprazole (PRILOSEC) 20 MG capsule Take 20 mg by mouth 2 (two) times daily before a meal.    Historical Provider, MD  predniSONE (STERAPRED UNI-PAK 21 TAB) 10 MG (21) TBPK tablet Take 1 tablet (10 mg total) by mouth daily. Taper by 10 mg daily until finished 06/10/16   Calvert Cantor, MD  sildenafil (VIAGRA) 100 MG tablet Take 100 mg by mouth daily as needed for erectile dysfunction.    Historical Provider, MD  simvastatin (ZOCOR) 40 MG tablet Take 60 mg by mouth daily.    Historical Provider, MD    Family History No family history on file.  Social History Social History  Substance Use Topics  . Smoking status: Current Every Day Smoker    Packs/day: 1.00    Types:  Cigarettes  . Smokeless tobacco: Former Neurosurgeon  . Alcohol use No     Allergies   Penicillins and Demerol [meperidine]   Review of Systems Review of Systems  Unable to perform ROS: Acuity of condition     Physical Exam Updated Vital Signs BP 126/67   Pulse 109   Resp 15   SpO2 96%   Physical Exam  Constitutional: He appears well-developed and well-nourished. He appears distressed.  HENT:  Head: Normocephalic and atraumatic.  Mouth/Throat: Oropharynx is clear and moist. No oropharyngeal exudate.  OP clear, no edema, abnormal phonation - severe stridor.  Eyes: Conjunctivae and EOM are normal. Pupils are equal, round, and reactive to light. Right eye exhibits no discharge. Left eye exhibits no discharge. No scleral icterus.  Neck: Normal range of motion. Neck supple. No JVD present. No thyromegaly present.  Cardiovascular: Regular rhythm, normal heart sounds and intact distal pulses.  Exam reveals no gallop and no friction rub.   No murmur heard. tachy  Pulmonary/Chest: Breath sounds normal. He is in respiratory distress. He has no wheezes. He has no rales.  insp stridor - inc WO"B  Abdominal: Soft. Bowel sounds are normal. He exhibits no distension and no mass. There is no tenderness.  Musculoskeletal: Normal range of motion. He exhibits no edema or tenderness.  Lymphadenopathy:    He has no cervical adenopathy.  Neurological: He is alert. Coordination normal.  Skin: Skin is warm and dry. No rash noted. No erythema.  Psychiatric: He has a normal mood and affect. His behavior is normal.  Nursing note and vitals reviewed.    ED Treatments / Results  Labs (all labs ordered are listed, but only abnormal results are displayed) Labs Reviewed  CBC WITH DIFFERENTIAL/PLATELET - Abnormal; Notable for the following:       Result Value   Platelets 417 (*)    Monocytes Absolute 1.4 (*)    All other components within normal limits  I-STAT CHEM 8, ED - Abnormal; Notable for the  following:    Sodium 133 (*)    Chloride 97 (*)    Glucose, Bld 111 (*)    All other components within normal limits    EKG  EKG Interpretation None       Radiology Dg Neck Soft Tissue  Result Date: 07/05/2016 CLINICAL DATA:  Sore throat. EXAM: NECK SOFT TISSUES - 1+ VIEW COMPARISON:  Neck CT 06/09/2016 FINDINGS: The epiglottis and area epiglottic folds are normal. Mild subglottic fullness and calcifications are noted. Stable surgical changes involving the right neck and stable carotid artery calcifications. The bony structures are stable. Advanced degenerative cervical spondylosis. The lung apices are clear. IMPRESSION: Mild subglottic fullness is stable. The epiglottis is normal.  Electronically Signed   By: Rudie Meyer M.D.   On: 07/05/2016 18:22    Procedures Procedures (including critical care time)  Medications Ordered in ED Medications  povidone-iodine (BETADINE) 10 % external solution (not administered)  Racepinephrine HCl 2.25 % nebulizer solution 0.5 mL (0.5 mLs Nebulization Given 07/05/16 1708)  EPINEPHrine (EPI-PEN) injection 0.3 mg (0.3 mg Intramuscular Given 07/05/16 1705)  methylPREDNISolone sodium succinate (SOLU-MEDROL) 125 mg/2 mL injection 125 mg (125 mg Intravenous Given 07/05/16 1705)  diphenhydrAMINE (BENADRYL) injection 50 mg (50 mg Intravenous Given 07/05/16 1706)  famotidine (PEPCID) IVPB 20 mg premix (0 mg Intravenous Stopped 07/05/16 1737)     Initial Impression / Assessment and Plan / ED Course  I have reviewed the triage vital signs and the nursing notes.  Pertinent labs & imaging results that were available during my care of the patient were reviewed by me and considered in my medical decision making (see chart for details).  Clinical Course     Epi, solumedrol, benadryl and pepcid given on arrival Racemic epi neb ECG without findings  on ACE - consider could be edema from that, No visible edema on exam.    The pt has improved with meds, no  longer in need of airway intervention but needs close observation overnight.   D/w Dr. Conley Rolls who will admit.  Final Clinical Impressions(s) / ED Diagnoses   Final diagnoses:  Stridor    New Prescriptions New Prescriptions   No medications on file     Eber Hong, MD 07/05/16 1914

## 2016-07-06 DIAGNOSIS — J385 Laryngeal spasm: Secondary | ICD-10-CM | POA: Diagnosis not present

## 2016-07-06 MED ORDER — METHYLPREDNISOLONE SODIUM SUCC 40 MG IJ SOLR
40.0000 mg | Freq: Four times a day (QID) | INTRAMUSCULAR | Status: DC
  Administered 2016-07-06 (×2): 40 mg via INTRAVENOUS
  Filled 2016-07-06 (×2): qty 1

## 2016-07-06 MED ORDER — HEPARIN SODIUM (PORCINE) 5000 UNIT/ML IJ SOLN
5000.0000 [IU] | Freq: Three times a day (TID) | INTRAMUSCULAR | Status: DC
  Administered 2016-07-06 (×2): 5000 [IU] via SUBCUTANEOUS
  Filled 2016-07-06 (×2): qty 1

## 2016-07-06 MED ORDER — RACEPINEPHRINE HCL 2.25 % IN NEBU: 0.2500 mL | INHALATION_SOLUTION | RESPIRATORY_TRACT | Status: DC | PRN

## 2016-07-06 MED ORDER — ACETAMINOPHEN 500 MG PO TABS
500.0000 mg | ORAL_TABLET | ORAL | Status: DC | PRN
Start: 2016-07-06 — End: 2016-07-06

## 2016-07-06 MED ORDER — CARVEDILOL 12.5 MG PO TABS
25.0000 mg | ORAL_TABLET | Freq: Two times a day (BID) | ORAL | Status: DC
  Administered 2016-07-06: 25 mg via ORAL
  Filled 2016-07-06: qty 2

## 2016-07-06 MED ORDER — SODIUM CHLORIDE 0.9% FLUSH
3.0000 mL | Freq: Two times a day (BID) | INTRAVENOUS | Status: DC
  Administered 2016-07-06: 3 mL via INTRAVENOUS

## 2016-07-06 MED ORDER — NORTRIPTYLINE HCL 25 MG PO CAPS
25.0000 mg | ORAL_CAPSULE | Freq: Every day | ORAL | Status: DC
  Filled 2016-07-06 (×3): qty 1

## 2016-07-06 MED ORDER — DEXTROSE-NACL 5-0.9 % IV SOLN
INTRAVENOUS | Status: DC
  Administered 2016-07-06: 09:00:00 via INTRAVENOUS

## 2016-07-06 MED ORDER — FAMOTIDINE IN NACL 20-0.9 MG/50ML-% IV SOLN
20.0000 mg | Freq: Two times a day (BID) | INTRAVENOUS | Status: DC
  Administered 2016-07-06: 20 mg via INTRAVENOUS
  Filled 2016-07-06: qty 50

## 2016-07-06 MED ORDER — PANTOPRAZOLE SODIUM 40 MG PO TBEC
40.0000 mg | DELAYED_RELEASE_TABLET | Freq: Every day | ORAL | Status: DC
  Administered 2016-07-06: 40 mg via ORAL
  Filled 2016-07-06: qty 1

## 2016-07-06 MED ORDER — AMLODIPINE BESYLATE 5 MG PO TABS
10.0000 mg | ORAL_TABLET | Freq: Every day | ORAL | Status: DC
  Administered 2016-07-06: 10 mg via ORAL
  Filled 2016-07-06: qty 2

## 2016-07-06 MED ORDER — ATORVASTATIN CALCIUM 20 MG PO TABS
20.0000 mg | ORAL_TABLET | Freq: Every day | ORAL | Status: DC
Start: 2016-07-06 — End: 2016-07-06
  Filled 2016-07-06 (×3): qty 1

## 2016-07-06 NOTE — ED Notes (Signed)
Called in regards to Pt's desire to leave AMA due to inability [place] get [facility] [place] [facility].  Talked with patient and explained the potential consequences and risk of leaving AMA. Patient states he would stay if allowed to eat. I called Dr. Thedore MinsSingh who ordered clear liquid diet. Told patient that he could have liquids, including jello. He stated "no" if that's all I can have I am leaving.  Nurse(Robin) notified. Clarence Bradshaw

## 2016-07-06 NOTE — ED Notes (Signed)
Dr Singh into see pt

## 2016-07-06 NOTE — ED Notes (Addendum)
Pt refusing clear liquid diet. Insists on signing out AMA.

## 2016-07-06 NOTE — Progress Notes (Signed)
Pt is still at [facility]. [facility] this AM. I will be out of town later this AM.  If pt continues to do well, he may follow up with me next week in my office. If intervention is needed, please call the ENT physician on call for my office.

## 2016-07-06 NOTE — ED Notes (Signed)
Pt requesting breakfast. Had previously been given coffee. Paged Dr Thedore Minssingh. Pt to remain NPO due to pending transfer and procedure at [facility]. Pt updated states if he doesn't have a room by 12 today he will leave.

## 2016-07-06 NOTE — Progress Notes (Signed)
PROGRESS NOTE                                                                                                                                                                                                             Patient Demographics:    Clarence Bradshaw, is a 71 y.o. male, DOB - [birth year], ZOX:[id number]  Admit date - 07/05/2016   Admitting Physician No admitting provider for patient encounter.  Outpatient Primary MD for the patient is [facility]  LOS - 0  Chief Complaint  Patient presents with  . Oral Swelling       Brief Narrative  Clarence Bradshaw is an 71 y.o. male with hx of upper respiratory infection, with cord edema mid Dec 2017 requiring intubation, hx of COPD, active tobacco abuse, HTN on Lisinopril, returned to the ER with stridor and respiratory distress.  Last admission, [facility], and Dr Suszanne Conners recommeded finishing clindamycin and follow up outpatient, but he has breathing and came to the ER   Subjective:    Clarence Bradshaw today has, No headache, No chest pain, No abdominal pain - No Nausea, No new weakness tingling or numbness, No Cough - SOB. +ve stridor acute on chronic.   Assessment  & Plan :     1.Acute on chronic stridor with shortness of breath in a patient with history of laryngeal edema requiring intubation few weeks ago, possible infectious pharyngitis few weeks ago.  Patient was also on lisinopril which was started for blood pressure, lisinopril discontinued, he has received IV steroids, subcutaneous epinephrine, racemic epinephrine nebulizer along with Pepcid IV his stridor has improved and his shortness of breath has improved, he is on IV steroids. He is to be transferred to [facility] for ENT evaluation, he was accepted by Dr. Glennon Mac yesterday however he is going out of town this morning and since transfer has been delayed due  to bed availability ENT physician on call needs to be contacted once patient arrives. Patient is clinically better but due to his recent history of intubation for similar problem it will be appropriate that he be cleared by ENT prior to discharge. Kindly discontinue lisinopril indefinitely.  2. Hypertension. On Norvasc continue along with Coreg.  3. GERD. On PPI.  4. Dyslipidemia. On statin continue.  5. Tobacco abuse. Counseled to quit.   Patient voiced his desire to leave AMA, counseled and warned by me. For now has agreed to stay.    Family Communication  : None  Code Status :  Full  Diet : Diet NPO time specified   Disposition Plan  :  [facility]  Consults  :  ENT  Procedures  :    DVT Prophylaxis  :   Heparin   Lab Results  Component Value Date   PLT 417 (H) 07/05/2016    Inpatient Medications  Scheduled Meds: . amLODipine  10 mg Oral Daily  . atorvastatin  20 mg Oral q1800  . carvedilol  25 mg Oral BID WC  . methylPREDNISolone (SOLU-MEDROL) injection  40 mg Intravenous Q6H  . nortriptyline  25 mg Oral QHS  . pantoprazole  40 mg Oral Daily  . sodium chloride flush  3 mL Intravenous Q12H   Continuous Infusions: . dextrose 5 % and 0.9% NaCl    . famotidine (PEPCID) IV     PRN Meds:.acetaminophen, Racepinephrine HCl  Antibiotics  :    Anti-infectives    None         Objective:   Vitals:   07/06/16 0222 07/06/16 0723 07/06/16 0730 07/06/16 0800  BP: 116/63 151/87 148/80 159/70  Pulse: 83 109 108 94  Resp: 14 20    Temp:  98.5 F (36.9 C)    TempSrc:  Oral    SpO2: 98% 100% 96% 98%    Wt Readings from Last 3 Encounters:  06/07/16 66.2 kg (145 lb 15.1 oz)     Intake/Output Summary (Last 24 hours) at 07/06/16 0837 Last data filed at 07/05/16 1737  Gross per 24 hour  Intake               50 ml  Output                0 ml  Net               50 ml     Physical Exam  Awake Alert, Oriented X 3, No new F.N deficits, Normal  affect [place].AT,PERRAL Supple Neck,No JVD, No cervical lymphadenopathy appriciated.  Symmetrical Chest wall movement, Good air movement bilaterally, CTAB, +ve stridor RRR,No Gallops,Rubs or new Murmurs, No Parasternal Heave +ve B.Sounds, Abd Soft, No tenderness, No organomegaly appriciated, No rebound - guarding or rigidity. No Cyanosis, Clubbing or edema, No new Rash or bruise      Data Review:    CBC  Recent Labs Lab 07/05/16 1711 07/05/16 1714  WBC 8.2  --   HGB 13.5 14.3  HCT 39.5 42.0  PLT 417*  --   MCV 87.8  --   MCH 30.0  --   MCHC 34.2  --   RDW 14.7  --   LYMPHSABS 2.5  --   MONOABS 1.4*  --   EOSABS 0.4  --   BASOSABS 0.0  --     Chemistries   Recent Labs Lab 07/05/16 1714  NA 133*  K 4.4  CL 97*  GLUCOSE 111*  BUN 7  CREATININE 1.00   ------------------------------------------------------------------------------------------------------------------ No results for input(s): CHOL, HDL, LDLCALC, TRIG, CHOLHDL, LDLDIRECT in the last 72 hours.  No results found for: HGBA1C ------------------------------------------------------------------------------------------------------------------ No results for input(s): TSH, T4TOTAL, T3FREE, THYROIDAB in the last 72 hours.  Invalid input(s): FREET3 ------------------------------------------------------------------------------------------------------------------ No results for input(s): VITAMINB12, FOLATE, FERRITIN, TIBC, IRON, RETICCTPCT in the last 72 hours.  Coagulation  profile No results for input(s): INR, PROTIME in the last 168 hours.  No results for input(s): DDIMER in the last 72 hours.  Cardiac Enzymes No results for input(s): CKMB, TROPONINI, MYOGLOBIN in the last 168 hours.  Invalid input(s): CK ------------------------------------------------------------------------------------------------------------------ No results found for: BNP  Micro Results No results found for this or any previous visit  (from the past 240 hour(s)).  Radiology Reports Dg Neck Soft Tissue  Result Date: 07/05/2016 CLINICAL DATA:  Sore throat. EXAM: NECK SOFT TISSUES - 1+ VIEW COMPARISON:  Neck CT 06/09/2016 FINDINGS: The epiglottis and area epiglottic folds are normal. Mild subglottic fullness and calcifications are noted. Stable surgical changes involving the right neck and stable carotid artery calcifications. The bony structures are stable. Advanced degenerative cervical spondylosis. The lung apices are clear. IMPRESSION: Mild subglottic fullness is stable. The epiglottis is normal. Electronically Signed   By: Rudie MeyerP.  Gallerani M.D.   On: 07/05/2016 18:22       Time Spent in minutes  30   SINGH,PRASHANT K M.D on 07/06/2016 at 8:37 AM  Between 7am to 7pm - Pager - [phone]  After 7pm go to [email or web address] - password [email or web address]  [facility] -  Office  [phone]

## 2017-10-02 IMAGING — CT CT NECK W/ CM
3 of 5 series · 13 of 33 positions shown, 16 images · IV contrast (iopamidol)
Comparison: None.

CLINICAL DATA: Shortness of breath, globus sensation for 3 days.
Neck pain and stridor. History of COPD.

EXAM:
CT NECK WITH CONTRAST
TECHNIQUE: Multidetector CT imaging of the neck was performed using the
standard protocol following the bolus administration of intravenous
contrast.
CONTRAST:  75mL I01B1C-HCC IOPAMIDOL (I01B1C-HCC) INJECTION 61%

[Series 6: sag neck · sagittal · 0.42mm/px · 5 of 101 slices shown, 6 images]
[im 34/101  bone]
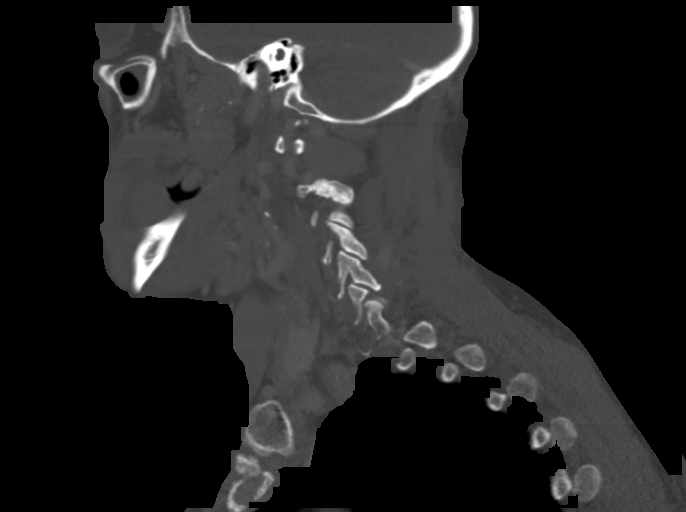
[im 42/101  bone]
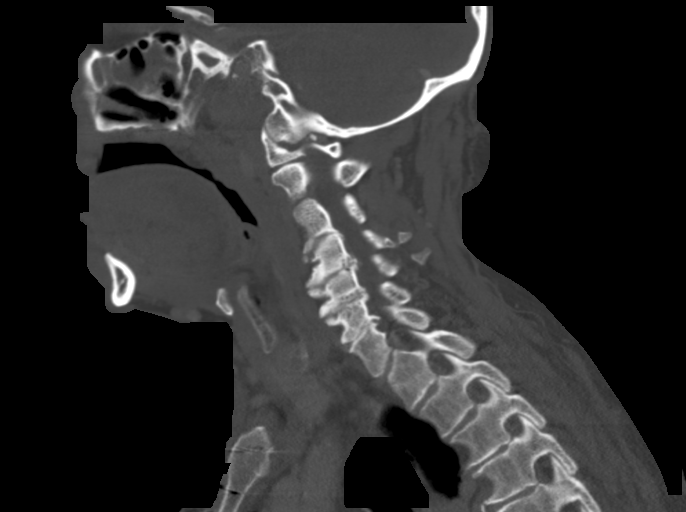
[im 51/101  soft-tissue]
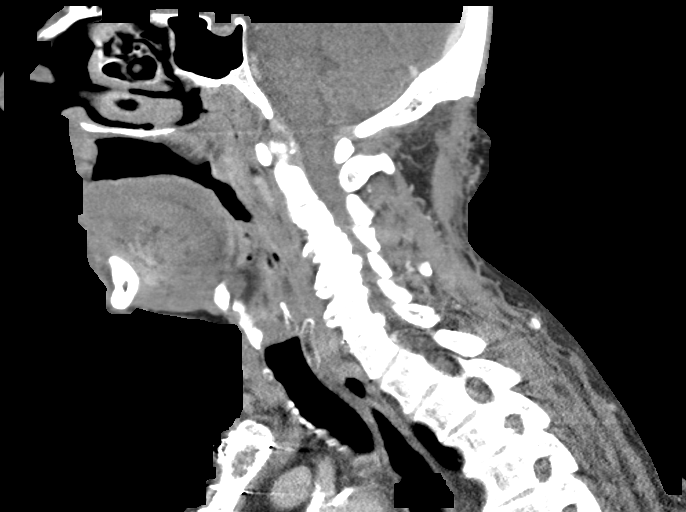
[im 51/101  bone]
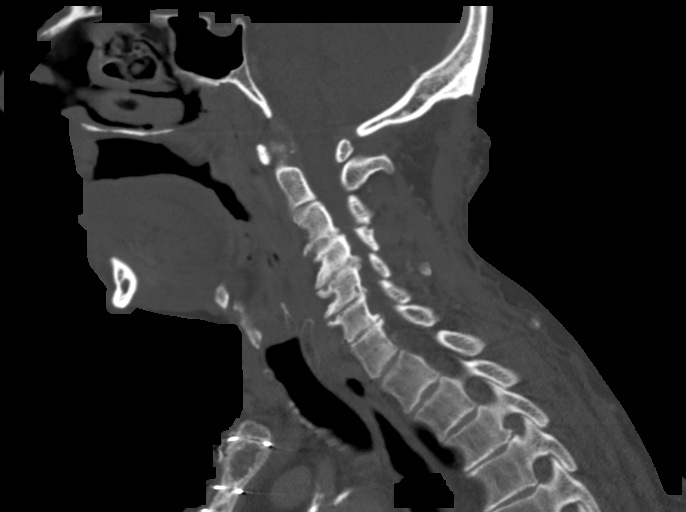
[im 59/101  bone]
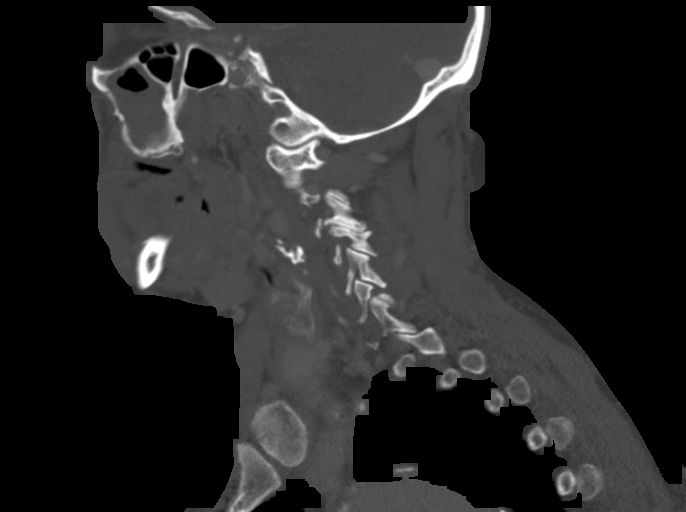
[im 67/101  bone]
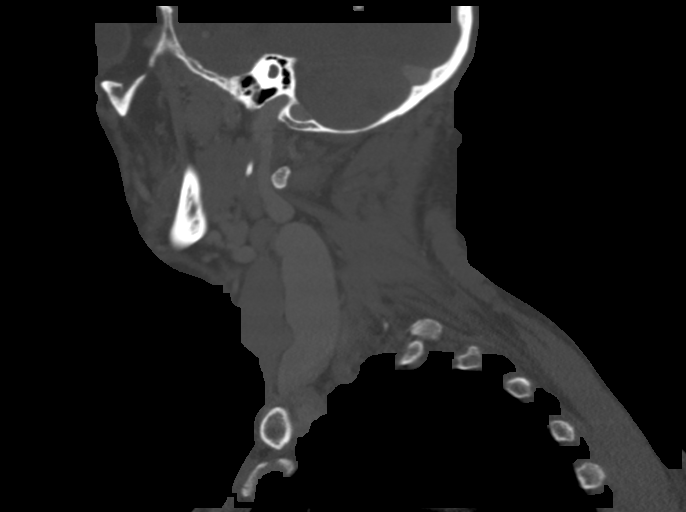

[Series 7: orthogonal ax · axial · 0.39mm/px · z∈[+1558,+1702]mm · 5 of 113 slices shown, 7 images]
[im 19/113  soft-tissue]
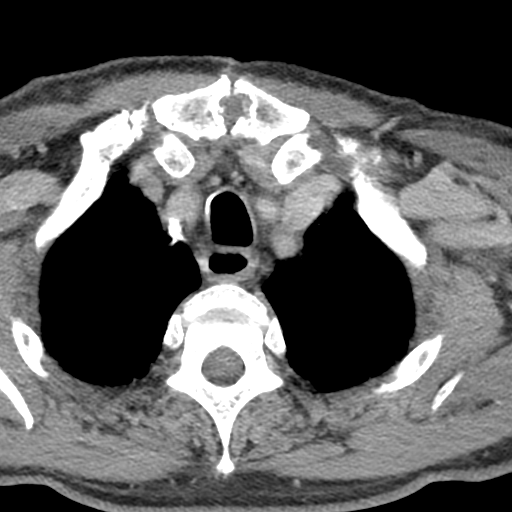
[im 19/113  bone]
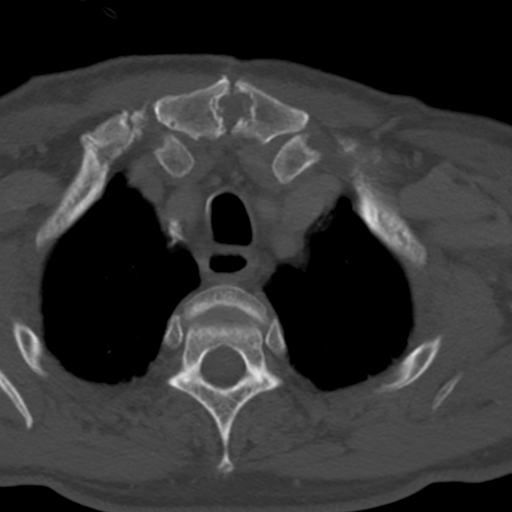
[im 38/113  bone]
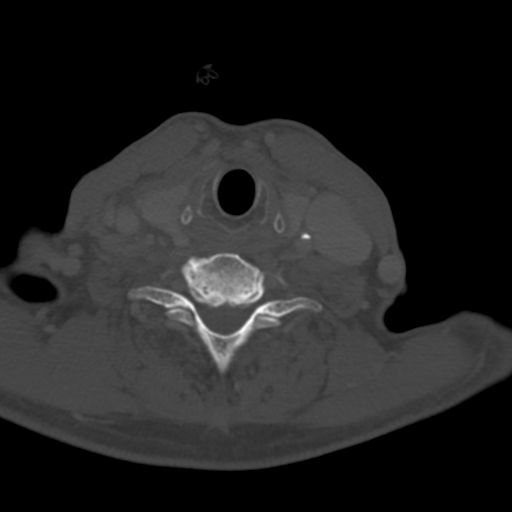
[im 57/113  bone]
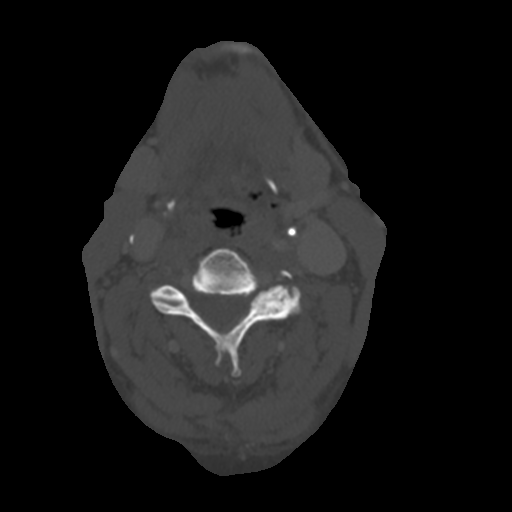
[im 75/113  bone]
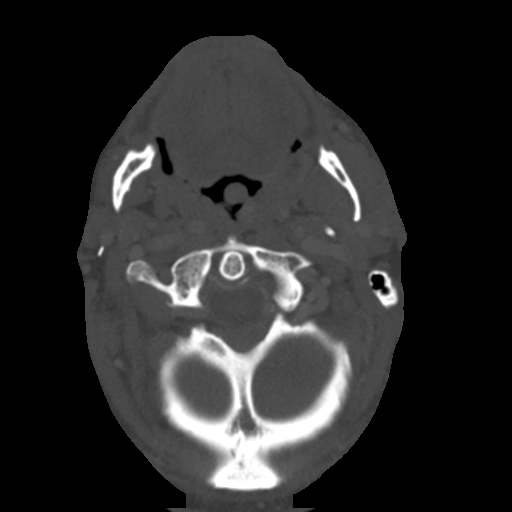
[im 94/113  soft-tissue]
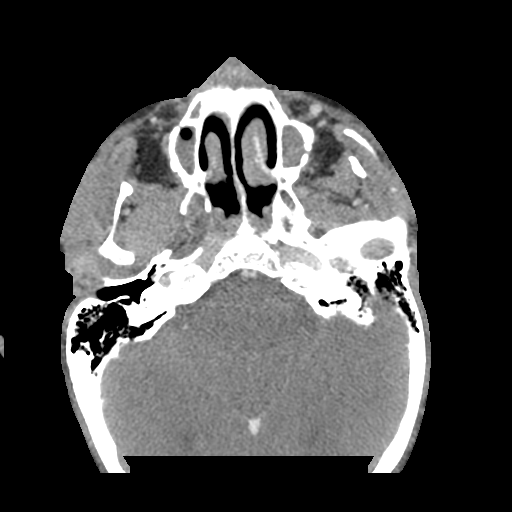
[im 94/113  bone]
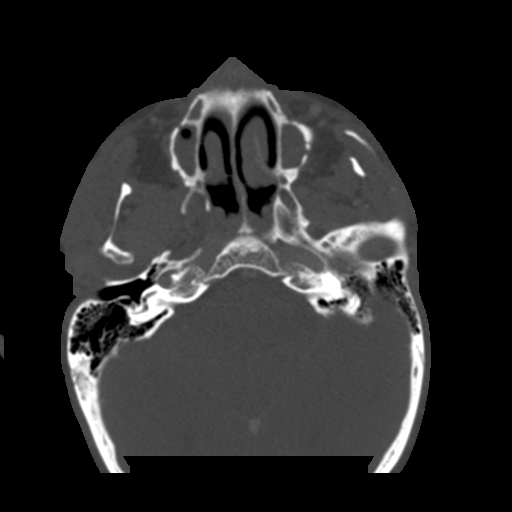

[Series 8: cor neck · coronal · 0.46mm/px · 3 of 107 slices shown]
[im 28/107  bone]
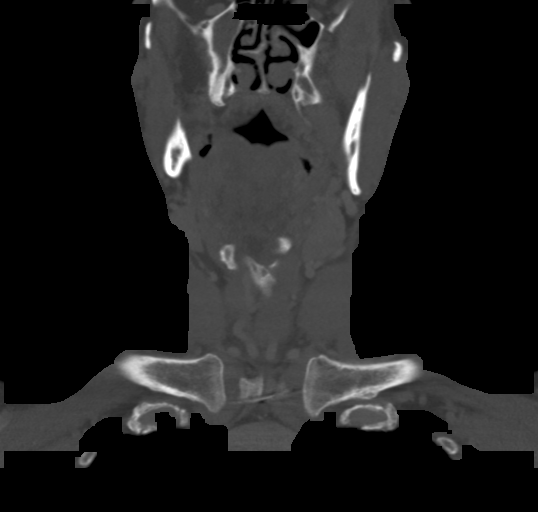
[im 45/107  bone]
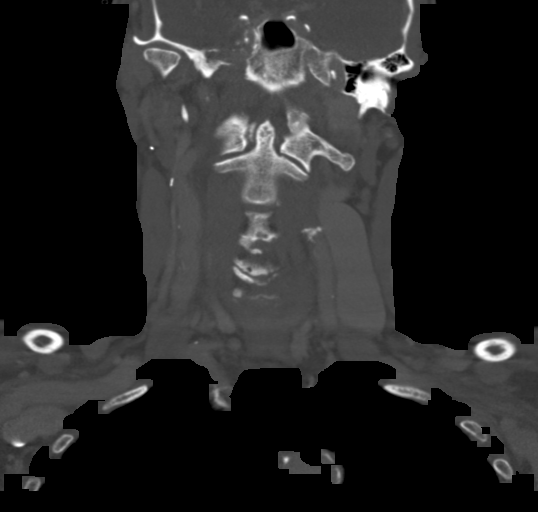
[im 62/107  bone]
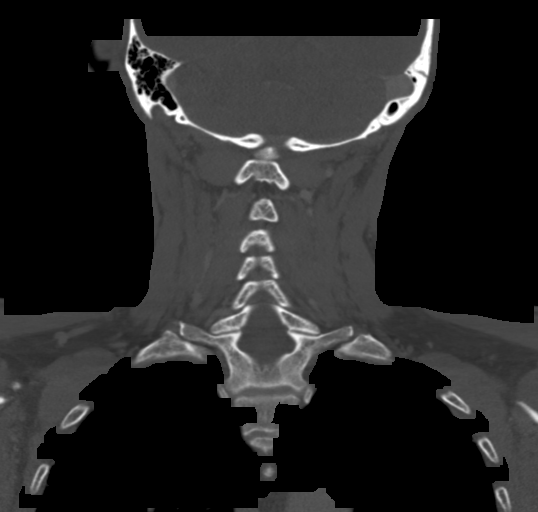

[13 of 33 positions shown; findings below may reference images not displayed]

FINDINGS: Pharynx and larynx: Severely enlarged adenoidal soft tissues, with
fullness of Waldeyer's ring. Secretions in the hypopharynx. Mildly
edematous epiglottis, apposition of the true vocal cords and false
vocal cords. No radiopaque foreign bodies or focal fluid collection.

Salivary glands: Symmetrically diminutive parotid glands with
prominent accessory parotid tissue. No acute process.

Thyroid: Normal.

Lymph nodes:

Vascular: Status post RIGHT carotid endarterectomy. Calcific
atherosclerosis results and suspected severe stenosis LEFT internal
carotid artery origin. Severe calcific atherosclerosis of the aortic
arch.

Limited intracranial: Severe calcific atherosclerosis of the carotid
siphons with suspected stenosis RIGHT supraclinoid internal carotid
artery. Old small LEFT cerebellar infarct.

Visualized orbits: Old LEFT medial orbital blowout fracture.

Mastoids and visualized paranasal sinuses: Severe bilateral
maxillary sinus soft tissue opacification with mucoperiosteal
reaction. Mild lobulated ethmoid mucosal thickening.

Skeleton: Status post median sternotomy. Multilevel severe/ moderate
to severe degenerative discs and severe facet arthropathy. Severe
LEFT C3-4, severe RIGHT and LEFT C4-5, moderate to severe C5-6
neural foraminal narrowing.

Upper chest: Partially imaged centrilobular emphysema. Subcentimeter
mediastinal lymph nodes are likely reactive.

Other:
IMPRESSION: Severe suspected pharyngitis/laryngitis with apposition of the true
vocal cords, which may be due to edema or phonation. Given degree of
pharyngeal enlargement though unlikely, lymphoproliferative disease
is a possibility.

Severe chronic maxillary sinusitis.

Severe intracranial atherosclerosis with suspected stenosis RIGHT
supraclinoid internal carotid artery. Probable severe LEFT internal
carotid artery stenosis.

Acute findings discussed with and reconfirmed by Dr.RABIH JOSIAH on
06/05/2016 at [DATE].

## 2018-09-07 ENCOUNTER — Observation Stay (HOSPITAL_COMMUNITY): Payer: Medicare PPO

## 2018-09-07 ENCOUNTER — Inpatient Hospital Stay (HOSPITAL_COMMUNITY)
Admission: EM | Admit: 2018-09-07 | Discharge: 2018-09-12 | DRG: 012 | Disposition: A | Discharge status: Home Health | Payer: Medicare PPO | Attending: Internal Medicine | Admitting: Internal Medicine

## 2018-09-07 ENCOUNTER — Encounter (HOSPITAL_COMMUNITY): Payer: Self-pay | Admitting: Family Medicine

## 2018-09-07 ENCOUNTER — Other Ambulatory Visit: Payer: Self-pay

## 2018-09-07 DIAGNOSIS — Z880 Allergy status to penicillin: Secondary | ICD-10-CM

## 2018-09-07 DIAGNOSIS — J386 Stenosis of larynx: Secondary | ICD-10-CM | POA: Diagnosis not present

## 2018-09-07 DIAGNOSIS — I5022 Chronic systolic (congestive) heart failure: Secondary | ICD-10-CM | POA: Diagnosis not present

## 2018-09-07 DIAGNOSIS — I2510 Atherosclerotic heart disease of native coronary artery without angina pectoris: Secondary | ICD-10-CM | POA: Diagnosis present

## 2018-09-07 DIAGNOSIS — Z885 Allergy status to narcotic agent status: Secondary | ICD-10-CM | POA: Diagnosis not present

## 2018-09-07 DIAGNOSIS — J449 Chronic obstructive pulmonary disease, unspecified: Secondary | ICD-10-CM | POA: Diagnosis present

## 2018-09-07 DIAGNOSIS — E871 Hypo-osmolality and hyponatremia: Secondary | ICD-10-CM | POA: Diagnosis present

## 2018-09-07 DIAGNOSIS — B370 Candidal stomatitis: Secondary | ICD-10-CM | POA: Diagnosis present

## 2018-09-07 DIAGNOSIS — K219 Gastro-esophageal reflux disease without esophagitis: Secondary | ICD-10-CM | POA: Diagnosis present

## 2018-09-07 DIAGNOSIS — R0602 Shortness of breath: Secondary | ICD-10-CM

## 2018-09-07 DIAGNOSIS — I110 Hypertensive heart disease with heart failure: Secondary | ICD-10-CM | POA: Diagnosis present

## 2018-09-07 DIAGNOSIS — J42 Unspecified chronic bronchitis: Secondary | ICD-10-CM | POA: Diagnosis not present

## 2018-09-07 DIAGNOSIS — I10 Essential (primary) hypertension: Secondary | ICD-10-CM | POA: Diagnosis present

## 2018-09-07 DIAGNOSIS — F17210 Nicotine dependence, cigarettes, uncomplicated: Secondary | ICD-10-CM | POA: Diagnosis present

## 2018-09-07 DIAGNOSIS — R061 Stridor: Secondary | ICD-10-CM | POA: Diagnosis present

## 2018-09-07 DIAGNOSIS — Z7951 Long term (current) use of inhaled steroids: Secondary | ICD-10-CM | POA: Diagnosis not present

## 2018-09-07 DIAGNOSIS — Z951 Presence of aortocoronary bypass graft: Secondary | ICD-10-CM | POA: Diagnosis not present

## 2018-09-07 DIAGNOSIS — F17200 Nicotine dependence, unspecified, uncomplicated: Secondary | ICD-10-CM

## 2018-09-07 DIAGNOSIS — E861 Hypovolemia: Secondary | ICD-10-CM | POA: Diagnosis not present

## 2018-09-07 DIAGNOSIS — J040 Acute laryngitis: Secondary | ICD-10-CM | POA: Diagnosis present

## 2018-09-07 DIAGNOSIS — R1310 Dysphagia, unspecified: Secondary | ICD-10-CM | POA: Diagnosis present

## 2018-09-07 LAB — CBC WITH DIFFERENTIAL/PLATELET
Abs Immature Granulocytes: 0.12 10*3/uL — ABNORMAL HIGH (ref 0.00–0.07)
Basophils Absolute: 0.1 10*3/uL (ref 0.0–0.1)
Basophils Relative: 0 %
Eosinophils Absolute: 0 10*3/uL (ref 0.0–0.5)
Eosinophils Relative: 0 %
HCT: 36.9 % — ABNORMAL LOW (ref 39.0–52.0)
Hemoglobin: 12.4 g/dL — ABNORMAL LOW (ref 13.0–17.0)
Immature Granulocytes: 1 %
Lymphocytes Relative: 7 %
Lymphs Abs: 1.1 10*3/uL (ref 0.7–4.0)
MCH: 28.8 pg (ref 26.0–34.0)
MCHC: 33.6 g/dL (ref 30.0–36.0)
MCV: 85.6 fL (ref 80.0–100.0)
Monocytes Absolute: 0.1 10*3/uL (ref 0.1–1.0)
Monocytes Relative: 1 %
NEUTROS PCT: 91 %
Neutro Abs: 14.4 10*3/uL — ABNORMAL HIGH (ref 1.7–7.7)
Platelets: 242 10*3/uL (ref 150–400)
RBC: 4.31 MIL/uL (ref 4.22–5.81)
RDW: 13.8 % (ref 11.5–15.5)
WBC: 15.9 10*3/uL — ABNORMAL HIGH (ref 4.0–10.5)
nRBC: 0 % (ref 0.0–0.2)

## 2018-09-07 LAB — POCT I-STAT EG7
Acid-base deficit: 1 mmol/L (ref 0.0–2.0)
Bicarbonate: 23.5 mmol/L (ref 20.0–28.0)
Calcium, Ion: 1.23 mmol/L (ref 1.15–1.40)
HCT: 36 % — ABNORMAL LOW (ref 39.0–52.0)
Hemoglobin: 12.2 g/dL — ABNORMAL LOW (ref 13.0–17.0)
O2 Saturation: 87 %
PH VEN: 7.411 (ref 7.250–7.430)
Potassium: 4.1 mmol/L (ref 3.5–5.1)
Sodium: 138 mmol/L (ref 135–145)
TCO2: 25 mmol/L (ref 22–32)
pCO2, Ven: 37 mmHg — ABNORMAL LOW (ref 44.0–60.0)
pO2, Ven: 53 mmHg — ABNORMAL HIGH (ref 32.0–45.0)

## 2018-09-07 LAB — RESPIRATORY PANEL BY PCR

## 2018-09-07 LAB — BASIC METABOLIC PANEL
ANION GAP: 12 (ref 5–15)
BUN: 17 mg/dL (ref 8–23)
CO2: 19 mmol/L — ABNORMAL LOW (ref 22–32)
Calcium: 8.8 mg/dL — ABNORMAL LOW (ref 8.9–10.3)
Chloride: 100 mmol/L (ref 98–111)
Creatinine, Ser: 1.17 mg/dL (ref 0.61–1.24)
GFR calc non Af Amer: >60 mL/min (ref >60)
Glucose, Bld: 123 mg/dL — ABNORMAL HIGH (ref 70–99)
Potassium: 3.9 mmol/L (ref 3.5–5.1)
SODIUM: 131 mmol/L — AB (ref 135–145)

## 2018-09-07 MED ORDER — ACETAMINOPHEN 650 MG RE SUPP: 650.0000 mg | Freq: Four times a day (QID) | RECTAL | Status: DC | PRN

## 2018-09-07 MED ORDER — DEXAMETHASONE SODIUM PHOSPHATE 10 MG/ML IJ SOLN
8.0000 mg | Freq: Three times a day (TID) | INTRAMUSCULAR | Status: DC
  Administered 2018-09-07 – 2018-09-11 (×11): 8 mg via INTRAVENOUS
  Filled 2018-09-07 (×11): qty 1

## 2018-09-07 MED ORDER — ONDANSETRON HCL 4 MG PO TABS: 4.0000 mg | ORAL_TABLET | Freq: Four times a day (QID) | ORAL | Status: DC | PRN

## 2018-09-07 MED ORDER — ONDANSETRON HCL 4 MG/2ML IJ SOLN: 4.0000 mg | Freq: Four times a day (QID) | INTRAMUSCULAR | Status: DC | PRN

## 2018-09-07 MED ORDER — ALBUTEROL SULFATE (2.5 MG/3ML) 0.083% IN NEBU
2.5000 mg | INHALATION_SOLUTION | RESPIRATORY_TRACT | Status: DC | PRN
  Administered 2018-09-08: 2.5 mg via RESPIRATORY_TRACT
  Filled 2018-09-07: qty 3

## 2018-09-07 MED ORDER — ACETAMINOPHEN 325 MG PO TABS
650.0000 mg | ORAL_TABLET | Freq: Four times a day (QID) | ORAL | Status: DC | PRN
  Filled 2018-09-07: qty 2

## 2018-09-07 MED ORDER — SODIUM CHLORIDE 0.9% FLUSH
3.0000 mL | Freq: Two times a day (BID) | INTRAVENOUS | Status: DC
  Administered 2018-09-09 – 2018-09-12 (×6): 3 mL via INTRAVENOUS

## 2018-09-07 MED ORDER — SODIUM CHLORIDE 0.9 % IV SOLN
INTRAVENOUS | Status: DC
  Administered 2018-09-08: via INTRAVENOUS

## 2018-09-07 NOTE — ED Notes (Addendum)
Attempted report to 2W  x2

## 2018-09-07 NOTE — ED Notes (Signed)
Pt had a jack-knife in his possession. Knife was given to security with the patients consent.

## 2018-09-07 NOTE — ED Notes (Signed)
Attempted report to 4E x1.

## 2018-09-07 NOTE — H&P (Signed)
History and Physical    Clarence Bradshaw YYQ:[id number] DOB: 1946-03-25 DOA: 09/07/2018  PCP: [facility]   Patient coming from: Home   Chief Complaint: SOB, odynophagia   HPI: Clarence Bradshaw is a 73 y.o. male with medical history significant for COPD and hypertension, now presenting to the emergency department for evaluation of shortness of breath and pain with swallowing.  Patient was admitted in December 2017 with supraglottic airway stenosis secondary to laryngeal swelling, suspected to be viral in etiology.  He made a full recovery per his report, but over the past 2 months, has developed recurrent shortness of breath and odynophagia.  He denies any fevers, chills, cough, or wheezing.  He reports that he has continued to eat and drink, with some pain, but without choking or vomiting.  [facility] ED Course: Upon arrival to the ED, patient is found to be afebrile, saturating well on room air, and with vitals otherwise normal.  He had a CT that demonstrates high-grade/critical stenosis of the supraglottic larynx with generalized supraglottic soft tissue thickening, almost identical to the scan from December 2017.  There was no ENT coverage at the initial facility, he was given 10 mg IV Decadron, and he was transferred to [facility] ED.  Here, he has a chemistry panel with hyponatremia and a CBC with leukocytosis to 15,900 and a mild normocytic anemia.  ENT was consulted by the ED physician and recommends observation on the medical service with Decadron 8 mg IV every 8 hours and respiratory virus panel.  Patient was given 8 mg of IV Decadron and respiratory virus panel was ordered.  He remains hemodynamically stable, is not in any acute distress, and will be observed for ongoing evaluation and management.  Review of Systems:  All other systems reviewed and apart from HPI, are negative.  Past Medical History:  Diagnosis Date  . COPD (chronic obstructive pulmonary disease) (HCC)    . Hypertension     Past Surgical History:  Procedure Laterality Date  . BACK SURGERY    . CARDIAC SURGERY    . CORONARY ARTERY BYPASS GRAFT     x3  . PERCUTANEOUS PLACEMENT INTRAVASCULAR STENT CERVICAL CAROTID ARTERY       reports that he has been smoking cigarettes. He has been smoking about 1.00 pack per day. He has quit using smokeless tobacco. He reports that he does not drink alcohol. No history on file for drug.  Allergies  Allergen Reactions  . Penicillins Anaphylaxis       . Demerol [Meperidine] Nausea Only    History reviewed. No pertinent family history.   Prior to Admission medications   Medication Sig Start Date End Date Taking? Authorizing Provider  acetaminophen (TYLENOL) 500 MG tablet Take 500 mg by mouth every 4 (four) hours as needed for moderate pain or headache.    [provider]  albuterol (PROVENTIL HFA;VENTOLIN HFA) 108 (90 Base) MCG/ACT inhaler Inhale 1-2 puffs into the lungs every 6 (six) hours as needed for wheezing or shortness of breath.    [provider]  amLODipine (NORVASC) 10 MG tablet Take 10 mg by mouth daily.    [provider]  atorvastatin (LIPITOR) 20 MG tablet Take 1 tablet (20 mg total) by mouth daily at 6 PM. 06/10/16   Calvert Cantor, MD  Calcium Carbonate 500 MG CHEW Chew 500 mg by mouth daily.    [provider]  carvedilol (COREG) 25 MG tablet Take 25 mg by mouth 2 (two)  times daily with a meal.    [provider]  Cholecalciferol (VITAMIN D3) 5000 units TABS Take 5,000 Units by mouth daily at 2 PM.     [provider]  clindamycin (CLEOCIN) 300 MG capsule Take 1 capsule (300 mg total) by mouth every 6 (six) hours. Patient not taking: Reported on 07/05/2016 06/10/16   Calvert Cantor, MD  fluticasone [facility]) 50 MCG/ACT nasal spray Place 2 sprays into both nostrils daily.    [provider]  ibuprofen (ADVIL,MOTRIN) 600 MG tablet Take 600 mg by mouth 2 (two) times daily as  needed for headache or moderate pain.    [provider]  lisinopril (PRINIVIL,ZESTRIL) 5 MG tablet Take 1 tablet (5 mg total) by mouth daily. 06/11/16   Calvert Cantor, MD  magnesium oxide (MAG-OX) 400 MG tablet Take 400 mg by mouth daily.    [provider]  naproxen (NAPROSYN) 500 MG tablet Take 1 tablet (500 mg total) by mouth 2 (two) times daily as needed. Patient not taking: Reported on 07/05/2016 06/10/16   Calvert Cantor, MD  nortriptyline (PAMELOR) 25 MG capsule Take 25 mg by mouth at bedtime.    [provider]  omeprazole (PRILOSEC) 20 MG capsule Take 20 mg by mouth 2 (two) times daily before a meal.    [provider]  predniSONE (STERAPRED UNI-PAK 21 TAB) 10 MG (21) TBPK tablet Take 1 tablet (10 mg total) by mouth daily. Taper by 10 mg daily until finished Patient not taking: Reported on 07/05/2016 06/10/16   Calvert Cantor, MD  sildenafil (VIAGRA) 100 MG tablet Take 100 mg by mouth daily as needed for erectile dysfunction.    [provider]  traMADol (ULTRAM) 50 MG tablet Take 50 mg by mouth 4 (four) times daily as needed. 06/28/16   [provider]    Physical Exam: Vitals:   09/07/18 2045 09/07/18 2100 09/07/18 2115 09/07/18 2130  BP: (!) 136/52 (!) 131/52 (!) 119/51 (!) 124/37  Pulse: 62 62 63 68  Resp: 12 17 10 16   Temp:      TempSrc:      SpO2: 97% 97% 99% 97%  Weight:      Height:        Constitutional: NAD, calm  Eyes: PERTLA, lids and conjunctivae normal ENMT: Mucous membranes are moist. Posterior pharynx clear of any exudate or lesions.   Neck: supple, nontender, no thyromegaly Respiratory: clear to auscultation bilaterally, no wheezing, no crackles. No accessory muscle use.  Cardiovascular: S1 & S2 heard, regular rate and rhythm. No extremity edema.  Abdomen: No distension, no tenderness, soft. Bowel sounds active.  Musculoskeletal: no clubbing / cyanosis. No joint deformity upper and lower extremities.    Skin:  no significant rashes, lesions, ulcers. Warm, dry, well-perfused. Neurologic: CN 2-12 grossly intact. Sensation intact. Strength 5/5 in all 4 limbs.  Psychiatric: Alert and oriented x 3. Calm, cooperative.    Labs on Admission: I have personally reviewed following labs and imaging studies  CBC: Recent Labs  Lab 09/07/18 2036 09/07/18 2041  WBC 15.9*  --   NEUTROABS 14.4*  --   HGB 12.4* 12.2*  HCT 36.9* 36.0*  MCV 85.6  --   PLT 242  --    Basic Metabolic Panel: Recent Labs  Lab 09/07/18 2036 09/07/18 2041  NA 131* 138  K 3.9 4.1  CL 100  --   CO2 19*  --   GLUCOSE 123*  --   BUN 17  --  CREATININE 1.17  --   CALCIUM 8.8*  --    GFR: Estimated Creatinine Clearance: 50.7 mL/min (by C-G formula based on SCr of 1.17 mg/dL). Liver Function Tests: No results for input(s): AST, ALT, ALKPHOS, BILITOT, PROT, ALBUMIN in the last 168 hours. No results for input(s): LIPASE, AMYLASE in the last 168 hours. No results for input(s): AMMONIA in the last 168 hours. Coagulation Profile: No results for input(s): INR, PROTIME in the last 168 hours. Cardiac Enzymes: No results for input(s): CKTOTAL, CKMB, CKMBINDEX, TROPONINI in the last 168 hours. BNP (last 3 results) No results for input(s): PROBNP in the last 8760 hours. HbA1C: No results for input(s): HGBA1C in the last 72 hours. CBG: No results for input(s): GLUCAP in the last 168 hours. Lipid Profile: No results for input(s): CHOL, HDL, LDLCALC, TRIG, CHOLHDL, LDLDIRECT in the last 72 hours. Thyroid Function Tests: No results for input(s): TSH, T4TOTAL, FREET4, T3FREE, THYROIDAB in the last 72 hours. Anemia Panel: No results for input(s): VITAMINB12, FOLATE, FERRITIN, TIBC, IRON, RETICCTPCT in the last 72 hours. Urine analysis: No results found for: COLORURINE, APPEARANCEUR, LABSPEC, PHURINE, GLUCOSEU, HGBUR, BILIRUBINUR, KETONESUR, PROTEINUR, UROBILINOGEN, NITRITE, LEUKOCYTESUR Sepsis Labs:  (procalcitonin:4,lacticidven:4) )No results found for this or any previous visit (from the past 240 hour(s)).   Radiological Exams on Admission: No results found.  EKG: Not performed.   Assessment/Plan   1. Supraglottic airway stenosis  - Pt has hx of acute pharyngitis/laryngitis with supraglottic airway stenosis in 2017, suspected to be viral, and he now p/w 2 months of odynophagia and progressive SOB without cough or wheeze  - He had CT at outside hospital just prior to transfer here that demonstrates high-grade/critical stenosis of supraglottic larynx with supraglottic soft tissue thickening almost identical to December 2017  - ENT is consulting and much appreciated, recommending observation on medical service with Decadron 8 mg IV q8h and respiratory virus panel  - Continue close monitoring in progressive unit, follow-up resp viral panel, continue Decadron    2. COPD  - No wheezing or cough  - Continue prn albuterol    3. Hypertension  - BP in low-normal range in ED, will hold antihypertensives initially    4. Hyponatremia  - Serum sodium is 131 on admission  - Likely hypovolemic in setting of recent odynophagia  - Gentle IVF hydration with saline, repeat chem panel in am     DVT prophylaxis: SCD's  Code Status: Full  Family Communication: Discussed with patient  Consults called: ENT Admission status: Observation     Briscoe Deutscher, MD [facility] Pager [phone]  If 7PM-7AM, please contact night-coverage [email or web address] Password [email or web address]  09/07/2018, 10:14 PM

## 2018-09-07 NOTE — ED Notes (Addendum)
Attempted report to 2W x1 

## 2018-09-07 NOTE — ED Provider Notes (Signed)
[facility] EMERGENCY DEPARTMENT Provider Note   CSN: [id number] Arrival date & time: 09/07/18  2012    History   Chief Complaint Chief Complaint  Patient presents with  . Shortness of Breath    HPI Clarence Bradshaw is a 73 y.o. male history of laryngitis here presenting with stridor.  Patient states that he has chronic shortness of breath and was intubated in 2017 for stridor.  He saw Dr. Suszanne Conners at that time and was found to have rhinovirus and eventually was sent home on clindamycin.  Patient states that he has some trouble breathing and sore throat for the last several days but acutely got worse since yesterday.  Patient was noted to have some inspiratory and expiratory stridor initially when he presented to [facility].  He then was given 10 mg of Decadron and states that the symptoms have improved. Dr. Jenne Pane from ENT was consulted and recommend transfer to [facility] for ENT evaluation.      The history is provided by the patient.    Past Medical History:  Diagnosis Date  . COPD (chronic obstructive pulmonary disease) (HCC)   . Hypertension     Patient Active Problem List   Diagnosis Date Noted  . Laryngeal spasm 07/05/2016  . Stridor 07/05/2016  . Acute respiratory failure with hypoxia (HCC)   . Acute pharyngitis 06/05/2016  . Hypomagnesemia 06/05/2016  . Hypokalemia 06/05/2016  . On mechanically assisted ventilation (HCC) 06/05/2016  . COPD (chronic obstructive pulmonary disease) (HCC) 06/05/2016  . Tobacco use disorder 06/05/2016  . Carotid artery stenosis 06/05/2016    Past Surgical History:  Procedure Laterality Date  . BACK SURGERY    . CARDIAC SURGERY    . CORONARY ARTERY BYPASS GRAFT     x3  . PERCUTANEOUS PLACEMENT INTRAVASCULAR STENT CERVICAL CAROTID ARTERY          Home Medications    Prior to Admission medications   Medication Sig Start Date End Date Taking? Authorizing Provider  acetaminophen (TYLENOL) 500 MG tablet Take 500 mg  by mouth every 4 (four) hours as needed for moderate pain or headache.    [provider]  albuterol (PROVENTIL HFA;VENTOLIN HFA) 108 (90 Base) MCG/ACT inhaler Inhale 1-2 puffs into the lungs every 6 (six) hours as needed for wheezing or shortness of breath.    [provider]  amLODipine (NORVASC) 10 MG tablet Take 10 mg by mouth daily.    [provider]  atorvastatin (LIPITOR) 20 MG tablet Take 1 tablet (20 mg total) by mouth daily at 6 PM. 06/10/16   Calvert Cantor, MD  Calcium Carbonate 500 MG CHEW Chew 500 mg by mouth daily.    [provider]  carvedilol (COREG) 25 MG tablet Take 25 mg by mouth 2 (two) times daily with a meal.    [provider]  Cholecalciferol (VITAMIN D3) 5000 units TABS Take 5,000 Units by mouth daily at 2 PM.     [provider]  clindamycin (CLEOCIN) 300 MG capsule Take 1 capsule (300 mg total) by mouth every 6 (six) hours. Patient not taking: Reported on 07/05/2016 06/10/16   Calvert Cantor, MD  fluticasone [facility]) 50 MCG/ACT nasal spray Place 2 sprays into both nostrils daily.    [provider]  ibuprofen (ADVIL,MOTRIN) 600 MG tablet Take 600 mg by mouth 2 (two) times daily as needed for headache or moderate pain.    [provider]  lisinopril (PRINIVIL,ZESTRIL) 5 MG tablet Take 1 tablet (5  mg total) by mouth daily. 06/11/16   Calvert Cantor, MD  magnesium oxide (MAG-OX) 400 MG tablet Take 400 mg by mouth daily.    [provider]  naproxen (NAPROSYN) 500 MG tablet Take 1 tablet (500 mg total) by mouth 2 (two) times daily as needed. Patient not taking: Reported on 07/05/2016 06/10/16   Calvert Cantor, MD  nortriptyline (PAMELOR) 25 MG capsule Take 25 mg by mouth at bedtime.    [provider]  omeprazole (PRILOSEC) 20 MG capsule Take 20 mg by mouth 2 (two) times daily before a meal.    [provider]  predniSONE (STERAPRED UNI-PAK 21 TAB) 10 MG (21) TBPK tablet Take 1  tablet (10 mg total) by mouth daily. Taper by 10 mg daily until finished Patient not taking: Reported on 07/05/2016 06/10/16   Calvert Cantor, MD  sildenafil (VIAGRA) 100 MG tablet Take 100 mg by mouth daily as needed for erectile dysfunction.    [provider]  traMADol (ULTRAM) 50 MG tablet Take 50 mg by mouth 4 (four) times daily as needed. 06/28/16   [provider]    Family History No family history on file.  Social History Social History   Tobacco Use  . Smoking status: Current Every Day Smoker    Packs/day: 1.00    Types: Cigarettes  . Smokeless tobacco: Former Engineer, water Use Topics  . Alcohol use: No  . Drug use: Not on file     Allergies   Penicillins and Demerol [meperidine]   Review of Systems Review of Systems  HENT: Positive for sore throat and voice change.   All other systems reviewed and are negative.    Physical Exam Updated Vital Signs BP (!) 168/99 (BP Location: Right Arm)   Pulse 71   Temp 97.8 F (36.6 C) (Oral)   Resp 15   Ht  (1.676 m)   Wt 72.6 kg   SpO2 97%   BMI 25.82 kg/m   Physical Exam Vitals signs and nursing note reviewed.  Constitutional:      Appearance: He is well-developed.  HENT:     Head: Normocephalic.  Eyes:     Extraocular Movements: Extraocular movements intact.     Pupils: Pupils are equal, round, and reactive to light.  Neck:     Comments: Minimal inspiratory stridor. No audible stridor  Cardiovascular:     Rate and Rhythm: Normal rate and regular rhythm.  Pulmonary:     Effort: Pulmonary effort is normal.     Breath sounds: Normal breath sounds.  Abdominal:     General: Bowel sounds are normal.     Palpations: Abdomen is soft.  Musculoskeletal: Normal range of motion.  Skin:    General: Skin is warm.     Capillary Refill: Capillary refill takes less than 2 seconds.  Neurological:     General: No focal deficit present.     Mental Status: He is alert and oriented to person,  place, and time.  Psychiatric:        Mood and Affect: Mood normal.        Behavior: Behavior normal.      ED Treatments / Results  Labs (all labs ordered are listed, but only abnormal results are displayed) Labs Reviewed  CBC WITH DIFFERENTIAL/PLATELET - Abnormal; Notable for the following components:      Result Value   WBC 15.9 (*)    Hemoglobin 12.4 (*)    HCT 36.9 (*)  Neutro Abs 14.4 (*)    Abs Immature Granulocytes 0.12 (*)    All other components within normal limits  BASIC METABOLIC PANEL - Abnormal; Notable for the following components:   Sodium 131 (*)    CO2 19 (*)    Glucose, Bld 123 (*)    Calcium 8.8 (*)    All other components within normal limits  POCT I-STAT EG7 - Abnormal; Notable for the following components:   pCO2, Ven 37.0 (*)    pO2, Ven 53.0 (*)    HCT 36.0 (*)    Hemoglobin 12.2 (*)    All other components within normal limits  RESPIRATORY PANEL BY PCR    EKG None  Radiology No results found.  Procedures Procedures (including critical care time)  Medications Ordered in ED Medications  dexamethasone (DECADRON) injection 8 mg (has no administration in time range)     Initial Impression / Assessment and Plan / ED Course  I have reviewed the triage vital signs and the nursing notes.  Pertinent labs & imaging results that were available during my care of the patient were reviewed by me and considered in my medical decision making (see chart for details).       Tiberius Jahoda is a 73 y.o. male here presenting with stridor.  Patient had a CT at [facility] that showed that stenosis of the supraglottic airway.  Patient received 10 mg Decadron already and has minimal has stridor only with inspiration.  Patient is talking in 2-3 word sentences.  Dr. Jenne Pane was consulted initially but upon chart review, Dr. Suszanne Conners saw the patient previously in the hospital. I consulted Dr. Suszanne Conners and he recommend decadron 8 mg Q 8 hrs. He will see patient in  AM. He also recommend Respiratory viral panel as patient had rhinovirus when he was intubated in 2017.   9:21 PM Labs showed WBC 15. VBG reassuring. Will admit for observation and Dr. Suszanne Conners to see in AM. RVP sent.    Final Clinical Impressions(s) / ED Diagnoses   Final diagnoses:  None    ED Discharge Orders    None       Charlynne Pander, MD 09/07/18 2136

## 2018-09-07 NOTE — ED Triage Notes (Signed)
Pt from [facility] via carelink.Pt c/o having difficulty swallowing x1 week, SOB and ongestion. Pt has hx of epiglotitis and epiglotic swelling and had to have a procedure in the past to correct this. Vitals prior to arrival: 171/58, HR 62, RR 17, Temp 97.8, SpO2 97% on 2L [place], pt is A+O x4.

## 2018-09-07 NOTE — Progress Notes (Signed)
Pt received from ED to 4E18. Initial assessment, vitals, CHG complete. Tele applied CCMD notified. Pt oriented to room and call bell system. Will continue to monitor.   

## 2018-09-07 NOTE — ED Notes (Signed)
ED TO INPATIENT HANDOFF REPORT  ED Nurse Name and Phone #:  [place] [phone]  S Name/Age/Gender Clarence Bradshaw 73 y.o. male Room/Bed: 016C/016C  Code Status   Code Status: Full Code  Home/SNF/Other Home Patient oriented to: self, place, time and situation Is this baseline? Yes   Triage Complete: Triage complete  Chief Complaint respiratory system  Triage Note Pt from [facility] via carelink.Pt c/o having difficulty swallowing x1 week, SOB and ongestion. Pt has hx of epiglotitis and epiglotic swelling and had to have a procedure in the past to correct this. Vitals prior to arrival: 171/58, HR 62, RR 17, Temp 97.8, SpO2 97% on 2L [place], pt is A+O x4.   Allergies Allergies  Allergen Reactions  . Penicillins Anaphylaxis       . Demerol [Meperidine] Nausea Only    Level of Care/Admitting Diagnosis ED Disposition    ED Disposition Condition Comment   Admit  Hospital Area: [facility] [[id number]]  Level of Care: Progressive [102]  I expect the patient will be discharged within 24 hours: Yes  LOW acuity---Tx typically complete <24 hrs---ACUTE conditions typically can be evaluated <24 hours---LABS likely to return to acceptable levels <24 hours---IS near functional baseline---EXPECTED to return to current living arrangement---NOT newly hypoxic: Does not meet criteria for 5C-Observation unit  Diagnosis: Laryngitis [621308]  Admitting Physician: Briscoe Deutscher [[id number]]  Attending Physician: Briscoe Deutscher [[id number]]  PT Class (Do Not Modify): Observation [104]  PT Acc Code (Do Not Modify): Observation [10022]       B Medical/Surgery History Past Medical History:  Diagnosis Date  . COPD (chronic obstructive pulmonary disease) (HCC)   . Hypertension    Past Surgical History:  Procedure Laterality Date  . BACK SURGERY    . CARDIAC SURGERY    . CORONARY ARTERY BYPASS GRAFT     x3  . PERCUTANEOUS PLACEMENT INTRAVASCULAR STENT CERVICAL CAROTID ARTERY        A IV Location/Drains/Wounds Patient Lines/Drains/Airways Status   Active Line/Drains/Airways    Name:   Placement date:   Placement time:   Site:   Days:   Peripheral IV 09/07/18 Left Antecubital   09/07/18    2029    Antecubital   less than 1          Intake/Output Last 24 hours No intake or output data in the 24 hours ending 09/07/18 2233  Labs/Imaging Results for orders placed or performed during the hospital encounter of 09/07/18 (from the past 48 hour(s))  CBC with Differential/Platelet     Status: Abnormal   Collection Time: 09/07/18  8:36 PM  Result Value Ref Range   WBC 15.9 (H) 4.0 - 10.5 K/uL   RBC 4.31 4.22 - 5.81 MIL/uL   Hemoglobin 12.4 (L) 13.0 - 17.0 g/dL   HCT 13.2 (L) 44.0 - 10.2 %   MCV 85.6 80.0 - 100.0 fL   MCH 28.8 26.0 - 34.0 pg   MCHC 33.6 30.0 - 36.0 g/dL   RDW 72.5 36.6 - 44.0 %   Platelets 242 150 - 400 K/uL   nRBC 0.0 0.0 - 0.2 %   Neutrophils Relative % 91 %   Neutro Abs 14.4 (H) 1.7 - 7.7 K/uL   Lymphocytes Relative 7 %   Lymphs Abs 1.1 0.7 - 4.0 K/uL   Monocytes Relative 1 %   Monocytes Absolute 0.1 0.1 - 1.0 K/uL   Eosinophils Relative 0 %   Eosinophils Absolute 0.0 0.0 - 0.5 K/uL  Basophils Relative 0 %   Basophils Absolute 0.1 0.0 - 0.1 K/uL   Immature Granulocytes 1 %   Abs Immature Granulocytes 0.12 (H) 0.00 - 0.07 K/uL    Comment: Performed at [facility], [address] [place], [place] [address]  Basic metabolic panel     Status: Abnormal   Collection Time: 09/07/18  8:36 PM  Result Value Ref Range   Sodium 131 (L) 135 - 145 mmol/L   Potassium 3.9 3.5 - 5.1 mmol/L   Chloride 100 98 - 111 mmol/L   CO2 19 (L) 22 - 32 mmol/L   Glucose, Bld 123 (H) 70 - 99 mg/dL   BUN 17 8 - 23 mg/dL   Creatinine, Ser 6.041.17 0.61 - 1.24 mg/dL   Calcium 8.8 (L) 8.9 - 10.3 mg/dL   GFR calc non Af Amer >60 >60 mL/min   GFR calc Af Amer >60 >60 mL/min   Anion gap 12 5 - 15    Comment: Performed at [facility], [address] [place], [place] [address]  POCT I-Stat EG7     Status: Abnormal   Collection Time: 09/07/18  8:41 PM  Result Value Ref Range   pH, Ven 7.411 7.250 - 7.430   pCO2, Ven 37.0 (L) 44.0 - 60.0 mmHg   pO2, Ven 53.0 (H) 32.0 - 45.0 mmHg   Bicarbonate 23.5 20.0 - 28.0 mmol/L   TCO2 25 22 - 32 mmol/L   O2 Saturation 87.0 %   Acid-base deficit 1.0 0.0 - 2.0 mmol/L   Sodium 138 135 - 145 mmol/L   Potassium 4.1 3.5 - 5.1 mmol/L   Calcium, Ion 1.23 1.15 - 1.40 mmol/L   HCT 36.0 (L) 39.0 - 52.0 %   Hemoglobin 12.2 (L) 13.0 - 17.0 g/dL   Patient temperature HIDE    Sample type VENOUS    No results found.  Pending Labs Unresulted Labs (From admission, onward)    Start     Ordered   09/08/18 0500  Basic metabolic panel  Tomorrow morning,   R     09/07/18 2213   09/08/18 0500  CBC WITH DIFFERENTIAL  Tomorrow morning,   R     09/07/18 2213   09/07/18 2026  Respiratory Panel by PCR  (Respiratory virus panel with precautions)  Once,   R     09/07/18 2025          Vitals/Pain Today's Vitals   09/07/18 2045 09/07/18 2100 09/07/18 2115 09/07/18 2130  BP: (!) 136/52 (!) 131/52 (!) 119/51 (!) 124/37  Pulse: 62 62 63 68  Resp: 12 17 10 16   Temp:      TempSrc:      SpO2: 97% 97% 99% 97%  Weight:      Height:      PainSc:        Isolation Precautions Droplet precaution  Medications Medications  dexamethasone (DECADRON) injection 8 mg (8 mg Intravenous Given 09/07/18 2142)  albuterol (PROVENTIL) (2.5 MG/3ML) 0.083% nebulizer solution 2.5 mg (has no administration in time range)  sodium chloride flush (NS) 0.9 % injection 3 mL (has no administration in time range)  0.9 %  sodium chloride infusion (has no administration in time range)  acetaminophen (TYLENOL) tablet 650 mg (has no administration in time range)    Or  acetaminophen (TYLENOL) suppository 650 mg (has no administration in time range)  ondansetron (ZOFRAN) tablet 4 mg (has no administration in time range)  Or  ondansetron  (ZOFRAN) injection 4 mg (has no administration in time range)    Mobility walks Low fall risk   Focused Assessments Pulmonary Assessment Handoff:  Lung sounds: Bilateral Breath Sounds: Expiratory wheezes, Stridor L Breath Sounds: Expiratory wheezes, Stridor R Breath Sounds: Expiratory wheezes, Stridor O2 Device: Nasal Cannula O2 Flow Rate (L/min): 2 L/min      R Recommendations: See Admitting Provider Note  Report given to:   Additional Notes:  Pt with stridor and wheezing. 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

## 2018-09-08 ENCOUNTER — Inpatient Hospital Stay (HOSPITAL_COMMUNITY): Payer: Medicare PPO | Admitting: Anesthesiology

## 2018-09-08 ENCOUNTER — Encounter (HOSPITAL_COMMUNITY): Payer: Self-pay | Admitting: Anesthesiology

## 2018-09-08 ENCOUNTER — Encounter (HOSPITAL_COMMUNITY)
Admission: EM | Disposition: A | Discharge status: Home Health | Payer: Self-pay | Source: Home / Self Care | Attending: Internal Medicine

## 2018-09-08 DIAGNOSIS — I10 Essential (primary) hypertension: Secondary | ICD-10-CM | POA: Diagnosis not present

## 2018-09-08 DIAGNOSIS — I2510 Atherosclerotic heart disease of native coronary artery without angina pectoris: Secondary | ICD-10-CM | POA: Diagnosis present

## 2018-09-08 DIAGNOSIS — Z880 Allergy status to penicillin: Secondary | ICD-10-CM | POA: Diagnosis not present

## 2018-09-08 DIAGNOSIS — Z7951 Long term (current) use of inhaled steroids: Secondary | ICD-10-CM | POA: Diagnosis not present

## 2018-09-08 DIAGNOSIS — K219 Gastro-esophageal reflux disease without esophagitis: Secondary | ICD-10-CM | POA: Diagnosis present

## 2018-09-08 DIAGNOSIS — F17210 Nicotine dependence, cigarettes, uncomplicated: Secondary | ICD-10-CM | POA: Diagnosis present

## 2018-09-08 DIAGNOSIS — Z951 Presence of aortocoronary bypass graft: Secondary | ICD-10-CM | POA: Diagnosis not present

## 2018-09-08 DIAGNOSIS — E871 Hypo-osmolality and hyponatremia: Secondary | ICD-10-CM | POA: Diagnosis present

## 2018-09-08 DIAGNOSIS — J438 Other emphysema: Secondary | ICD-10-CM | POA: Diagnosis not present

## 2018-09-08 DIAGNOSIS — J386 Stenosis of larynx: Secondary | ICD-10-CM | POA: Diagnosis present

## 2018-09-08 DIAGNOSIS — I5022 Chronic systolic (congestive) heart failure: Secondary | ICD-10-CM | POA: Diagnosis present

## 2018-09-08 DIAGNOSIS — R1310 Dysphagia, unspecified: Secondary | ICD-10-CM | POA: Diagnosis present

## 2018-09-08 DIAGNOSIS — Z885 Allergy status to narcotic agent status: Secondary | ICD-10-CM | POA: Diagnosis not present

## 2018-09-08 DIAGNOSIS — E861 Hypovolemia: Secondary | ICD-10-CM | POA: Diagnosis present

## 2018-09-08 DIAGNOSIS — J449 Chronic obstructive pulmonary disease, unspecified: Secondary | ICD-10-CM | POA: Diagnosis present

## 2018-09-08 DIAGNOSIS — R061 Stridor: Secondary | ICD-10-CM

## 2018-09-08 DIAGNOSIS — I110 Hypertensive heart disease with heart failure: Secondary | ICD-10-CM | POA: Diagnosis present

## 2018-09-08 DIAGNOSIS — J42 Unspecified chronic bronchitis: Secondary | ICD-10-CM | POA: Diagnosis not present

## 2018-09-08 DIAGNOSIS — B370 Candidal stomatitis: Secondary | ICD-10-CM | POA: Diagnosis present

## 2018-09-08 DIAGNOSIS — J040 Acute laryngitis: Secondary | ICD-10-CM | POA: Diagnosis not present

## 2018-09-08 HISTORY — PX: TRACHEOSTOMY TUBE PLACEMENT: SHX814

## 2018-09-08 LAB — BASIC METABOLIC PANEL
Anion gap: 7 (ref 5–15)
BUN: 17 mg/dL (ref 8–23)
CO2: 20 mmol/L — ABNORMAL LOW (ref 22–32)
Calcium: 8.9 mg/dL (ref 8.9–10.3)
Chloride: 108 mmol/L (ref 98–111)
Creatinine, Ser: 1.24 mg/dL (ref 0.61–1.24)
GFR calc Af Amer: >60 mL/min (ref >60)
GFR calc non Af Amer: 57 mL/min — ABNORMAL LOW (ref >60)
Glucose, Bld: 164 mg/dL — ABNORMAL HIGH (ref 70–99)
Potassium: 4.5 mmol/L (ref 3.5–5.1)
Sodium: 135 mmol/L (ref 135–145)

## 2018-09-08 LAB — GLUCOSE, CAPILLARY
GLUCOSE-CAPILLARY: 128 mg/dL — AB (ref 70–99)
Glucose-Capillary: 148 mg/dL — ABNORMAL HIGH (ref 70–99)

## 2018-09-08 LAB — CBC WITH DIFFERENTIAL/PLATELET
Abs Immature Granulocytes: 0.14 10*3/uL — ABNORMAL HIGH (ref 0.00–0.07)
Basophils Absolute: 0 10*3/uL (ref 0.0–0.1)
Basophils Relative: 0 %
EOS ABS: 0 10*3/uL (ref 0.0–0.5)
Eosinophils Relative: 0 %
HCT: 35.4 % — ABNORMAL LOW (ref 39.0–52.0)
Hemoglobin: 11.9 g/dL — ABNORMAL LOW (ref 13.0–17.0)
Immature Granulocytes: 1 %
Lymphocytes Relative: 7 %
Lymphs Abs: 0.9 10*3/uL (ref 0.7–4.0)
MCH: 28.7 pg (ref 26.0–34.0)
MCHC: 33.6 g/dL (ref 30.0–36.0)
MCV: 85.3 fL (ref 80.0–100.0)
Monocytes Absolute: 0.1 10*3/uL (ref 0.1–1.0)
Monocytes Relative: 1 %
Neutro Abs: 12.8 10*3/uL — ABNORMAL HIGH (ref 1.7–7.7)
Neutrophils Relative %: 91 %
Platelets: 233 10*3/uL (ref 150–400)
RBC: 4.15 MIL/uL — ABNORMAL LOW (ref 4.22–5.81)
RDW: 13.7 % (ref 11.5–15.5)
WBC: 14 10*3/uL — ABNORMAL HIGH (ref 4.0–10.5)
nRBC: 0 % (ref 0.0–0.2)

## 2018-09-08 LAB — MRSA PCR SCREENING: MRSA BY PCR: NEGATIVE

## 2018-09-08 SURGERY — CREATION, TRACHEOSTOMY
Anesthesia: General | Site: Neck

## 2018-09-08 MED ORDER — SUCCINYLCHOLINE CHLORIDE 200 MG/10ML IV SOSY
PREFILLED_SYRINGE | INTRAVENOUS | Status: DC | PRN
  Administered 2018-09-08: 100 mg via INTRAVENOUS

## 2018-09-08 MED ORDER — FENTANYL CITRATE (PF) 100 MCG/2ML IJ SOLN
25.0000 ug | INTRAMUSCULAR | Status: DC | PRN
  Administered 2018-09-08 (×2): 50 ug via INTRAVENOUS

## 2018-09-08 MED ORDER — OXYMETAZOLINE HCL 0.05 % NA SOLN
1.0000 | Freq: Once | NASAL | Status: DC | PRN
  Filled 2018-09-08: qty 30

## 2018-09-08 MED ORDER — ROCURONIUM BROMIDE 10 MG/ML (PF) SYRINGE
PREFILLED_SYRINGE | INTRAVENOUS | Status: DC | PRN
  Administered 2018-09-08: 15 mg via INTRAVENOUS

## 2018-09-08 MED ORDER — LACTATED RINGERS IV SOLN: INTRAVENOUS | Status: DC

## 2018-09-08 MED ORDER — SUGAMMADEX SODIUM 200 MG/2ML IV SOLN
INTRAVENOUS | Status: DC | PRN
  Administered 2018-09-08: 200 mg via INTRAVENOUS

## 2018-09-08 MED ORDER — LIDOCAINE-EPINEPHRINE 1 %-1:100000 IJ SOLN
INTRAMUSCULAR | Status: DC | PRN
  Administered 2018-09-08: 4 mL

## 2018-09-08 MED ORDER — LIDOCAINE HCL 4 % EX SOLN
0.0000 mL | Freq: Once | CUTANEOUS | Status: DC | PRN
  Filled 2018-09-08: qty 50

## 2018-09-08 MED ORDER — LACTATED RINGERS IV SOLN
INTRAVENOUS | Status: DC | PRN
  Administered 2018-09-08: 18:00:00 via INTRAVENOUS

## 2018-09-08 MED ORDER — FENTANYL CITRATE (PF) 100 MCG/2ML IJ SOLN
INTRAMUSCULAR | Status: AC
  Filled 2018-09-08: qty 2

## 2018-09-08 MED ORDER — 0.9 % SODIUM CHLORIDE (POUR BTL) OPTIME
TOPICAL | Status: DC | PRN
  Administered 2018-09-08: 1000 mL

## 2018-09-08 MED ORDER — ONDANSETRON HCL 4 MG/2ML IJ SOLN
INTRAMUSCULAR | Status: DC | PRN
  Administered 2018-09-08: 4 mg via INTRAVENOUS

## 2018-09-08 MED ORDER — SILVER NITRATE-POT NITRATE 75-25 % EX MISC
1.0000 | Freq: Once | CUTANEOUS | Status: DC | PRN
  Filled 2018-09-08: qty 1

## 2018-09-08 MED ORDER — LIDOCAINE-EPINEPHRINE 1 %-1:100000 IJ SOLN
INTRAMUSCULAR | Status: AC
  Filled 2018-09-08: qty 1

## 2018-09-08 MED ORDER — FLUCONAZOLE IN SODIUM CHLORIDE 200-0.9 MG/100ML-% IV SOLN
200.0000 mg | INTRAVENOUS | Status: AC
  Administered 2018-09-08: 200 mg via INTRAVENOUS
  Filled 2018-09-08: qty 100

## 2018-09-08 MED ORDER — LIDOCAINE HCL 2 % EX GEL
1.0000 "application " | Freq: Once | CUTANEOUS | Status: DC | PRN
  Filled 2018-09-08: qty 4250

## 2018-09-08 MED ORDER — FENTANYL CITRATE (PF) 250 MCG/5ML IJ SOLN
INTRAMUSCULAR | Status: AC
  Filled 2018-09-08: qty 5

## 2018-09-08 MED ORDER — LIDOCAINE-EPINEPHRINE (PF) 1 %-1:200000 IJ SOLN
0.0000 mL | Freq: Once | INTRAMUSCULAR | Status: DC | PRN
  Filled 2018-09-08: qty 30

## 2018-09-08 MED ORDER — CHLORHEXIDINE GLUCONATE 0.12 % MT SOLN
15.0000 mL | Freq: Two times a day (BID) | OROMUCOSAL | Status: DC
  Administered 2018-09-08 – 2018-09-12 (×8): 15 mL via OROMUCOSAL
  Filled 2018-09-08 (×3): qty 15

## 2018-09-08 MED ORDER — ACETAMINOPHEN 325 MG PO TABS: 325.0000 mg | ORAL_TABLET | Freq: Once | ORAL | Status: DC

## 2018-09-08 MED ORDER — ACETAMINOPHEN 160 MG/5ML PO SOLN: 325.0000 mg | Freq: Once | ORAL | Status: DC

## 2018-09-08 MED ORDER — LIDOCAINE 2% (20 MG/ML) 5 ML SYRINGE
INTRAMUSCULAR | Status: DC | PRN
  Administered 2018-09-08: 60 mg via INTRAVENOUS

## 2018-09-08 MED ORDER — ORAL CARE MOUTH RINSE
15.0000 mL | Freq: Two times a day (BID) | OROMUCOSAL | Status: DC
  Administered 2018-09-09 – 2018-09-12 (×6): 15 mL via OROMUCOSAL

## 2018-09-08 MED ORDER — PROPOFOL 10 MG/ML IV BOLUS
INTRAVENOUS | Status: DC | PRN
  Administered 2018-09-08: 110 mg via INTRAVENOUS
  Administered 2018-09-08: 20 mg via INTRAVENOUS

## 2018-09-08 MED ORDER — TRIPLE ANTIBIOTIC 3.5-400-5000 EX OINT
1.0000 "application " | TOPICAL_OINTMENT | Freq: Once | CUTANEOUS | Status: DC | PRN
  Filled 2018-09-08: qty 1

## 2018-09-08 MED ORDER — FLUCONAZOLE 100MG IVPB
100.0000 mg | INTRAVENOUS | Status: DC
  Administered 2018-09-09 – 2018-09-11 (×3): 100 mg via INTRAVENOUS
  Filled 2018-09-08 (×4): qty 50

## 2018-09-08 MED ORDER — PROMETHAZINE HCL 25 MG/ML IJ SOLN: 6.2500 mg | INTRAMUSCULAR | Status: DC | PRN

## 2018-09-08 MED ORDER — FENTANYL CITRATE (PF) 250 MCG/5ML IJ SOLN
INTRAMUSCULAR | Status: DC | PRN
  Administered 2018-09-08 (×3): 50 ug via INTRAVENOUS

## 2018-09-08 MED ORDER — SODIUM CHLORIDE 0.9 % IV SOLN
INTRAVENOUS | Status: AC
  Administered 2018-09-08 (×2): via INTRAVENOUS

## 2018-09-08 MED ORDER — CLINDAMYCIN PHOSPHATE 300 MG/50ML IV SOLN
300.0000 mg | Freq: Three times a day (TID) | INTRAVENOUS | Status: DC
  Administered 2018-09-08 – 2018-09-11 (×9): 300 mg via INTRAVENOUS
  Filled 2018-09-08 (×11): qty 50

## 2018-09-08 MED ORDER — ACETAMINOPHEN 10 MG/ML IV SOLN: 1000.0000 mg | Freq: Once | INTRAVENOUS | Status: DC | PRN

## 2018-09-08 SURGICAL SUPPLY — 42 items
BLADE 10 SAFETY STRL DISP (BLADE) IMPLANT
BLADE 11 SAFETY STRL DISP (BLADE) ×3 IMPLANT
BLADE 15 SAFETY STRL DISP (BLADE) ×3 IMPLANT
BLADE CLIPPER SURG (BLADE) IMPLANT
BLADE SURG 15 STRL LF DISP TIS (BLADE) IMPLANT
BLADE SURG 15 STRL SS (BLADE)
CANISTER SUCT 3000ML PPV (MISCELLANEOUS) ×3 IMPLANT
CLEANER TIP ELECTROSURG 2X2 (MISCELLANEOUS) IMPLANT
COVER SURGICAL LIGHT HANDLE (MISCELLANEOUS) ×3 IMPLANT
COVER WAND RF STERILE (DRAPES) ×3 IMPLANT
DECANTER SPIKE VIAL GLASS SM (MISCELLANEOUS) ×3 IMPLANT
DRAPE HALF SHEET 40X57 (DRAPES) IMPLANT
ELECT COATED BLADE 2.86 ST (ELECTRODE) ×3 IMPLANT
ELECT REM PT RETURN 9FT ADLT (ELECTROSURGICAL) ×3
ELECTRODE REM PT RTRN 9FT ADLT (ELECTROSURGICAL) ×1 IMPLANT
GAUZE 4X4 16PLY RFD (DISPOSABLE) ×3 IMPLANT
GAUZE XEROFORM 5X9 LF (GAUZE/BANDAGES/DRESSINGS) IMPLANT
GLOVE ECLIPSE 7.5 STRL STRAW (GLOVE) ×3 IMPLANT
GOWN STRL REUS W/ TWL LRG LVL3 (GOWN DISPOSABLE) ×3 IMPLANT
GOWN STRL REUS W/TWL LRG LVL3 (GOWN DISPOSABLE) ×6
HOLDER TRACH TUBE VELCRO 19.5 (MISCELLANEOUS) ×3 IMPLANT
KIT BASIN OR (CUSTOM PROCEDURE TRAY) ×3 IMPLANT
KIT SUCTION CATH 14FR (SUCTIONS) IMPLANT
KIT TURNOVER KIT B (KITS) ×3 IMPLANT
NEEDLE HYPO 25GX1X1/2 BEV (NEEDLE) ×3 IMPLANT
NS IRRIG 1000ML POUR BTL (IV SOLUTION) ×3 IMPLANT
PAD ARMBOARD 7.5X6 YLW CONV (MISCELLANEOUS) ×6 IMPLANT
PENCIL BUTTON HOLSTER BLD 10FT (ELECTRODE) ×3 IMPLANT
SPONGE INTESTINAL PEANUT (DISPOSABLE) ×3 IMPLANT
SUT CHROMIC 3 0 SH 27 (SUTURE) ×3 IMPLANT
SUT PROLENE 2 0 SH DA (SUTURE) ×3 IMPLANT
SUT SILK 3 0 (SUTURE) ×2
SUT SILK 3-0 18XBRD TIE 12 (SUTURE) ×1 IMPLANT
SYR 20CC LL (SYRINGE) IMPLANT
SYR BULB 3OZ (MISCELLANEOUS) IMPLANT
SYR CONTROL 10ML LL (SYRINGE) ×3 IMPLANT
TOWEL OR 17X24 6PK STRL BLUE (TOWEL DISPOSABLE) ×3 IMPLANT
TRAY ENT MC OR (CUSTOM PROCEDURE TRAY) ×3 IMPLANT
TUBE CONNECTING 12'X1/4 (SUCTIONS) ×1
TUBE CONNECTING 12X1/4 (SUCTIONS) ×2 IMPLANT
TUBE TRACH SHILEY  6 DIST  CUF (TUBING) ×3 IMPLANT
WATER STERILE IRR 1000ML POUR (IV SOLUTION) ×3 IMPLANT

## 2018-09-08 NOTE — Op Note (Signed)
DATE OF PROCEDURE:  09/08/2018                              OPERATIVE REPORT  SURGEON:  Newman Pies, MD  PREOPERATIVE DIAGNOSES: 1. Upper airway obstruction  POSTOPERATIVE DIAGNOSES: 1. Upper airway obstruction  PROCEDURE PERFORMED:  Tracheostomy  ANESTHESIA:  General endotracheal tube anesthesia.  COMPLICATIONS:  None.  ESTIMATED BLOOD LOSS:  Minimal.  INDICATION FOR PROCEDURE:  Clarence Bradshaw is a 73 y.o. male with a history of recurrent respiratory distress due to upper airway obstruction. He was previously admitted on multiple occasions for the upper airway obstruction. He was admitted in 2017 and 2018, requiring a course of hospitalization and intubation. He re-presented to the ER yesterday with the same problem. He had a CT at [facility] that demonstrated high-grade/critical stenosis of the supraglottic larynx with generalized supraglottic soft tissue thickening, almost identical to the scan from December 2017.  The decision was therefore made to proceed with tracheostomy tube placement to protect his airway.  The risks, benefits, alternatives, and details of the procedure were discussed with the patient.  Questions were invited and answered.  Informed consent was obtained.  DESCRIPTION:  The patient was taken to the operating room and placed supine on the operating table. General anesthesia was administered via an endotracheal tube. The patient was positioned and prepped and draped in the standard fashion for tracheostomy tube placement. 1% lidocaine with 1-100,000 epinephrine was injected at the anterior neck. A 2 cm vertical incision was made in the anterior necks, at the level slightly below the cricoid bone. The incision was carried down past the level of the platysma muscle. The strep muscles were identified and divided in midline. They were retracted laterally, exposing the thyroid gland. The thyroid was divided at midline and retracted laterally. The anterior tracheal wall was  exposed. A tracheal window was made at the level of the second tracheal ring. The endotracheal tube was withdrawn. A # 6 cuffed Shiley tracheostomy tube was placed without difficulty. Good end tidal volume and CO2 return was noted. The tracheostomy tube was secured in place with 2-0 Prolene sutures and circumferential necktie. The care of the patient was transferred to the anesthesiologist. The patient was awakened from anesthesia without difficulty. He was transferred back to the recovery room in stable condition.  OPERATIVE FINDINGS:  A # 6 cuffed Shiley tracheostomy tube was placed without difficulty.  SPECIMEN:  None.  FOLLOWUP CARE:  The patient will admitted to the ICU.   [name] 09/08/2018 6:41 PM

## 2018-09-08 NOTE — Transfer of Care (Signed)
Immediate Anesthesia Transfer of Care Note  Patient: Clarence Bradshaw  Procedure(s) Performed: TRACHEOSTOMY (N/A Neck)  Patient Location: PACU  Anesthesia Type:General  Level of Consciousness: awake, alert  and oriented  Airway & Oxygen Therapy: Patient Spontanous Breathing and Patient connected to tracheostomy mask oxygen  Post-op Assessment: Report given to RN and Post -op Vital signs reviewed and stable  Post vital signs: Reviewed and stable  Last Vitals:  Vitals Value Taken Time  BP 118/52 09/08/2018  6:46 PM  Temp    Pulse 76 09/08/2018  6:49 PM  Resp 19 09/08/2018  6:49 PM  SpO2 99 % 09/08/2018  6:49 PM  Vitals shown include unvalidated device data.  Last Pain:  Vitals:   09/08/18 1128  TempSrc: Oral  PainSc:          Complications: No apparent anesthesia complications

## 2018-09-08 NOTE — Plan of Care (Signed)
  Problem: Education: Goal: Knowledge of General Education information will improve Description: Including pain rating scale, medication(s)/side effects and non-pharmacologic comfort measures Outcome: Progressing   Problem: Health Behavior/Discharge Planning: Goal: Ability to manage health-related needs will improve Outcome: Progressing   Problem: Clinical Measurements: Goal: Ability to maintain clinical measurements within normal limits will improve Outcome: Progressing Goal: Will remain free from infection Outcome: Progressing Goal: Diagnostic test results will improve Outcome: Progressing Goal: Respiratory complications will improve Outcome: Progressing Goal: Cardiovascular complication will be avoided Outcome: Progressing   Problem: Activity: Goal: Risk for activity intolerance will decrease Outcome: Progressing   Problem: Nutrition: Goal: Adequate nutrition will be maintained Outcome: Progressing   Problem: Coping: Goal: Level of anxiety will decrease Outcome: Progressing   Problem: Elimination: Goal: Will not experience complications related to bowel motility Outcome: Progressing Goal: Will not experience complications related to urinary retention Outcome: Progressing   Problem: Pain Managment: Goal: General experience of comfort will improve Outcome: Progressing   Problem: Safety: Goal: Ability to remain free from injury will improve Outcome: Progressing   Problem: Skin Integrity: Goal: Risk for impaired skin integrity will decrease Outcome: Progressing   Problem: Education: Goal: Knowledge about tracheostomy care/management will improve Outcome: Progressing   Problem: Activity: Goal: Ability to tolerate increased activity will improve Outcome: Progressing   Problem: Health Behavior/Discharge Planning: Goal: Ability to manage tracheostomy will improve Outcome: Progressing   Problem: Respiratory: Goal: Patent airway maintenance will  improve Outcome: Progressing   Problem: Role Relationship: Goal: Ability to communicate will improve Outcome: Progressing   

## 2018-09-08 NOTE — Anesthesia Preprocedure Evaluation (Addendum)
Anesthesia Evaluation  Patient identified by MRN, date of birth, ID band Patient awake    Reviewed: Allergy & Precautions, NPO status , Patient's Chart, lab work & pertinent test results, reviewed documented beta blocker date and time   Airway Mallampati: IV  TM Distance: <3 FB Neck ROM: Full    Dental  (+) Edentulous Upper, Edentulous Lower   Pulmonary COPD,  COPD inhaler, Current Smoker,     + decreased breath sounds+ wheezing      Cardiovascular hypertension, Pt. on home beta blockers  Rhythm:Regular Rate:Normal     Neuro/Psych negative neurological ROS  negative psych ROS   GI/Hepatic Neg liver ROS, GERD  Medicated,  Endo/Other  negative endocrine ROS  Renal/GU negative Renal ROS     Musculoskeletal negative musculoskeletal ROS (+)   Abdominal Normal abdominal exam  (+)   Peds  Hematology negative hematology ROS (+)   Anesthesia Other Findings   Reproductive/Obstetrics                             Lab Results  Component Value Date   WBC 14.0 (H) 09/08/2018   HGB 11.9 (L) 09/08/2018   HCT 35.4 (L) 09/08/2018   MCV 85.3 09/08/2018   PLT 233 09/08/2018   Lab Results  Component Value Date   CREATININE 1.24 09/08/2018   BUN 17 09/08/2018   NA 135 09/08/2018   K 4.5 09/08/2018   CL 108 09/08/2018   CO2 20 (L) 09/08/2018   No results found for: INR, PROTIME  Echo: - Left ventricle: Poor image quality mid and basal inferior wall   appear hypokinetic. Wall thickness was increased in a pattern of   moderate LVH. Systolic function was mildly reduced. The estimated   ejection fraction was in the range of 45% to 50%. Left   ventricular diastolic function parameters were normal. - Aortic valve: There was trivial regurgitation. - Left atrium: The atrium was moderately dilated. - Right atrium: The atrium was mildly dilated. - Atrial septum: No defect or patent foramen ovale was  identified. - Pulmonary arteries: PA peak pressure: 49 mm Hg (S).  Anesthesia Physical Anesthesia Plan  ASA: III  Anesthesia Plan: General   Post-op Pain Management:    Induction: Intravenous and Rapid sequence  PONV Risk Score and Plan: 2 and Ondansetron and Dexamethasone  Airway Management Planned: Oral ETT and Video Laryngoscope Planned  Additional Equipment: None  Intra-op Plan:   Post-operative Plan: Extubation in OR  Informed Consent: I have reviewed the patients History and Physical, chart, labs and discussed the procedure including the risks, benefits and alternatives for the proposed anesthesia with the patient or authorized representative who has indicated his/her understanding and acceptance.     Dental advisory given  Plan Discussed with: CRNA  Anesthesia Plan Comments: (Back up fiberoptic in room. )        Anesthesia Quick Evaluation

## 2018-09-08 NOTE — Progress Notes (Signed)
[facility] PROGRESS NOTE    Progress Note  Clarence Bradshaw  RUE:[id number] DOB: 12/10/1945 DOA: 09/07/2018 PCP: [facility]     Brief Narrative:   Clarence Bradshaw is an 73 y.o. 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  Assessment/Plan:   Supraglottic stenosis/ Laryngitis: Head secretions have remained greater than 95% on 2 L of oxygen. Virus panel was negative.  Continue IV dexamethasone, he has a mild leukocytosis, has remained afebrile. We have consulted ENT.  Oral thrush: He was previously on a course of steroids and antibiotics, will start IV Diflucan. Keep NPO.  COPD (chronic obstructive pulmonary disease) (HCC): No wheezing no cough, continue albuterol as needed.  Essential hypertension: Blood pressure stable, continue to hold antihypertensive medication.  Hypovolemic hyponatremia: Likely due to decreased oral intake start on normal saline has now resolved. To new IV fluids until odynophagia has improved.   DVT prophylaxis: lovenox Family Communication:none Disposition Plan/Barrier to D/C: unable to determine Code Status:     Code Status Orders  (From admission, onward)         Start     Ordered   09/07/18 2212  Full code  Continuous     09/07/18 2213        Code Status History    Date Active Date Inactive Code Status Order ID Comments User Context   07/06/2016 0745 07/06/2016 2004 Full Code [id number]  Houston Siren, MD ED   06/05/2016 2020 06/10/2016 1710 Full Code [id number]  Bobette Mo,  MD Inpatient        IV Access:    Peripheral IV   Procedures and diagnostic studies:   Dg Chest Port 1 View  Result Date: 09/07/2018 CLINICAL DATA:  Difficulty swallowing. Shortness of breath, congestion EXAM: PORTABLE CHEST 1 VIEW COMPARISON:  06/07/2016 FINDINGS: Prior CABG. Cardiomegaly. Increased interstitial markings in the lung bases compatible with scarring, stable. Mild hyperinflation. No acute bony abnormality. IMPRESSION: Hyperinflation. Stable interstitial markings in the lung bases, likely scarring. Mild cardiomegaly. No active disease. Electronically Signed   By: Charlett Nose M.D.   On: 09/07/2018 22:34     Medical Consultants:    None.  Anti-Infectives:   IV Diflucan started on 09/08/2018  Subjective:    Clarence Bradshaw relates that is unchanged from yesterday, he relates he is tired of breathing like this (psychologically).  Objective:    Vitals:   09/07/18 2300 09/07/18 2315 09/07/18 2346 09/08/18 0306  BP: 140/61 (!) 157/63 (!) 151/61 (!) 132/59  Pulse: 70 70  66  Resp: Temp:   98 F (36.7 C) 97.8 F (36.6 C)  TempSrc:   Oral Oral  SpO2: 98% 98%  98%  Weight:    70.3 kg  Height:        Intake/Output Summary (Last 24 hours) at 09/08/2018 0715 Last data filed at 09/08/2018 0400 Gross per 24 hour  Intake -  Output 200 ml  Net -200 ml   Filed Weights   09/07/18 2018 09/08/18 0306  Weight: 72.6 kg 70.3 kg    Exam: General  exam: In no acute distress, which what appears to be stridor Respiratory system: Good air movement and clear to auscultation, but he does have stridor upon entering the room he could hear it from the door Cardiovascular system: S1 & S2 heard, RRR.  Gastrointestinal system: Abdomen is nondistended, soft and nontender.  Central nervous system: Alert and oriented. No focal neurological deficits. Extremities: No pedal edema. Skin: No rashes, lesions or ulcers Psychiatry: Judgement and insight appear normal. Mood  & affect appropriate.    Data Reviewed:    Labs: Basic Metabolic Panel: Recent Labs  Lab 09/07/18 2036 09/07/18 2041 09/08/18 0257  NA 131* 138 135  K 3.9 4.1 4.5  CL 100  --  108  CO2 19*  --  20*  GLUCOSE 123*  --  164*  BUN 17  --  17  CREATININE 1.17  --  1.24  CALCIUM 8.8*  --  8.9   GFR Estimated Creatinine Clearance: 47.9 mL/min (by C-G formula based on SCr of 1.24 mg/dL). Liver Function Tests: No results for input(s): AST, ALT, ALKPHOS, BILITOT, PROT, ALBUMIN in the last 168 hours. No results for input(s): LIPASE, AMYLASE in the last 168 hours. No results for input(s): AMMONIA in the last 168 hours. Coagulation profile No results for input(s): INR, PROTIME in the last 168 hours.  CBC: Recent Labs  Lab 09/07/18 2036 09/07/18 2041 09/08/18 0257  WBC 15.9*  --  14.0*  NEUTROABS 14.4*  --  12.8*  HGB 12.4* 12.2* 11.9*  HCT 36.9* 36.0* 35.4*  MCV 85.6  --  85.3  PLT 242  --  233   Cardiac Enzymes: No results for input(s): CKTOTAL, CKMB, CKMBINDEX, TROPONINI in the last 168 hours. BNP (last 3 results) No results for input(s): PROBNP in the last 8760 hours. CBG: Recent Labs  Lab 09/08/18 0629  GLUCAP 148*   D-Dimer: No results for input(s): DDIMER in the last 72 hours. Hgb A1c: No results for input(s): HGBA1C in the last 72 hours. Lipid Profile: No results for input(s): CHOL, HDL, LDLCALC, TRIG, CHOLHDL, LDLDIRECT in the last 72 hours. Thyroid function studies: No results for input(s): TSH, T4TOTAL, T3FREE, THYROIDAB in the last 72 hours.  Invalid input(s): FREET3 Anemia work up: No results for input(s): VITAMINB12, FOLATE, FERRITIN, TIBC, IRON, RETICCTPCT in the last 72 hours. Sepsis Labs: Recent Labs  Lab 09/07/18 2036 09/08/18 0257  WBC 15.9* 14.0*   Microbiology Recent Results (from the past 240 hour(s))  Respiratory Panel by PCR     Status: None   Collection Time: 09/07/18  8:37 PM  Result Value Ref Range Status   Adenovirus NOT  DETECTED NOT DETECTED Final   Coronavirus 229E NOT DETECTED NOT DETECTED Final    Comment: (NOTE) The Coronavirus on the Respiratory Panel, DOES NOT test for the novel  Coronavirus (2019 nCoV)    Coronavirus HKU1 NOT DETECTED NOT DETECTED Final   Coronavirus NL63 NOT DETECTED NOT DETECTED Final   Coronavirus OC43 NOT DETECTED NOT DETECTED Final   Metapneumovirus NOT DETECTED NOT DETECTED Final   Rhinovirus / Enterovirus NOT DETECTED NOT DETECTED Final   Influenza A NOT DETECTED NOT DETECTED Final   Influenza B NOT DETECTED NOT DETECTED Final   Parainfluenza Virus 1 NOT DETECTED NOT DETECTED Final   Parainfluenza Virus 2 NOT DETECTED NOT DETECTED Final   Parainfluenza Virus 3 NOT DETECTED NOT DETECTED Final   Parainfluenza Virus 4 NOT DETECTED NOT DETECTED Final   Respiratory Syncytial Virus NOT DETECTED NOT DETECTED Final  Bordetella pertussis NOT DETECTED NOT DETECTED Final   Chlamydophila pneumoniae NOT DETECTED NOT DETECTED Final   Mycoplasma pneumoniae NOT DETECTED NOT DETECTED Final    Comment: Performed at [facility], [address], [place], [place] [address]     Medications:   . dexamethasone  8 mg Intravenous Q8H  . sodium chloride flush  3 mL Intravenous Q12H   Continuous Infusions: . sodium chloride 75 mL/hr at 09/08/18 0013      LOS: 0 days   Marinda Elk  [facility]  09/08/2018, 7:15 AM

## 2018-09-08 NOTE — Anesthesia Procedure Notes (Signed)
Procedure Name: Intubation Date/Time: 09/08/2018 6:08 PM Performed by: Ponciano Ort, CRNA Pre-anesthesia Checklist: Patient identified, Emergency Drugs available, Suction available and Patient being monitored Patient Re-evaluated:Patient Re-evaluated prior to induction Oxygen Delivery Method: Circle system utilized Preoxygenation: Pre-oxygenation with 100% oxygen Induction Type: IV induction Laryngoscope Size: Glidescope Grade View: Grade I Tube type: Oral Tube size: 6.5 mm Number of attempts: 1 Airway Equipment and Method: Video-laryngoscopy and Stylet Placement Confirmation: positive ETCO2,  breath sounds checked- equal and bilateral and ETT inserted through vocal cords under direct vision Secured at: 21 cm Tube secured with: Tape Dental Injury: Teeth and Oropharynx as per pre-operative assessment

## 2018-09-08 NOTE — Anesthesia Postprocedure Evaluation (Signed)
Anesthesia Post Note  Patient: Clarence Bradshaw  Procedure(s) Performed: TRACHEOSTOMY (N/A Neck)     Patient location during evaluation: PACU Anesthesia Type: General Level of consciousness: awake and alert Pain management: pain level controlled Vital Signs Assessment: post-procedure vital signs reviewed and stable Respiratory status: spontaneous breathing, nonlabored ventilation, respiratory function stable and patient connected to tracheostomy mask oxygen Cardiovascular status: blood pressure returned to baseline and stable Postop Assessment: no apparent nausea or vomiting Anesthetic complications: no    Last Vitals:  Vitals:   09/08/18 2045 09/08/18 2100  BP:  118/63  Pulse: 73 73  Resp: 20 17  Temp:    SpO2: 99% 98%    Last Pain:  Vitals:   09/08/18 2015  TempSrc:   PainSc: Asleep                 Tamkia Temples COKER

## 2018-09-08 NOTE — Consult Note (Signed)
Reason for Consult: Upper airway obstruction  HPI:  Clarence Bradshaw is an 73 y.o. male with a history of recurrent upper airway swelling, resulting airway obstruction. He was previously admitted in 2017 and 2018, requiring a course of hospitalization and intubation. He has a medical history significant for COPD and hypertension. He presented to the emergency department last night for evaluation of shortness of breath and pain with swallowing.  Patient was admitted in December 2017 with supraglottic airway stenosis secondary to laryngeal swelling.  He made a full recovery per his report. Over the past 2 months, he has developed recurrent shortness of breath and odynophagia.  He denies any fevers, chills, cough, or wheezing.  He reports that he has continued to eat and drink, with some pain, but without choking or vomiting. He had a CT at [facility] that demonstrated high-grade/critical stenosis of the supraglottic larynx with generalized supraglottic soft tissue thickening, almost identical to the scan from December 2017.   Past Medical History:  Diagnosis Date  . COPD (chronic obstructive pulmonary disease) (HCC)   . Hypertension     Past Surgical History:  Procedure Laterality Date  . BACK SURGERY    . CARDIAC SURGERY    . CORONARY ARTERY BYPASS GRAFT     x3  . PERCUTANEOUS PLACEMENT INTRAVASCULAR STENT CERVICAL CAROTID ARTERY      History reviewed. No pertinent family history.  Social History:  reports that he has been smoking cigarettes. He has been smoking about 1.00 pack per day. He has quit using smokeless tobacco. He reports that he does not drink alcohol. No history on file for drug.  Allergies:  Allergies  Allergen Reactions  . Penicillins Anaphylaxis       . Demerol [Meperidine] Nausea Only    Prior to Admission medications   Medication Sig Start Date End Date Taking? Authorizing Provider  acetaminophen (TYLENOL) 500 MG tablet Take 500 mg by mouth every 4 (four) hours  as needed for moderate pain or headache.    [provider]  albuterol (PROVENTIL HFA;VENTOLIN HFA) 108 (90 Base) MCG/ACT inhaler Inhale 1-2 puffs into the lungs every 6 (six) hours as needed for wheezing or shortness of breath.    [provider]  amLODipine (NORVASC) 10 MG tablet Take 10 mg by mouth daily.    [provider]  atorvastatin (LIPITOR) 20 MG tablet Take 1 tablet (20 mg total) by mouth daily at 6 PM. 06/10/16   Calvert Cantor, MD  Calcium Carbonate 500 MG CHEW Chew 500 mg by mouth daily.    [provider]  carvedilol (COREG) 25 MG tablet Take 25 mg by mouth 2 (two) times daily with a meal.    [provider]  Cholecalciferol (VITAMIN D3) 5000 units TABS Take 5,000 Units by mouth daily at 2 PM.     [provider]  clindamycin (CLEOCIN) 300 MG capsule Take 1 capsule (300 mg total) by mouth every 6 (six) hours. Patient not taking: Reported on 07/05/2016 06/10/16   Calvert Cantor, MD  fluticasone [facility]) 50 MCG/ACT nasal spray Place 2 sprays into both nostrils daily.    [provider]  ibuprofen (ADVIL,MOTRIN) 600 MG tablet Take 600 mg by mouth 2 (two) times daily as needed for headache or moderate pain.    [provider]  lisinopril (PRINIVIL,ZESTRIL) 5 MG tablet Take 1 tablet (5 mg total) by mouth daily. 06/11/16   Calvert Cantor, MD  magnesium oxide (MAG-OX) 400 MG tablet Take 400 mg by mouth  daily.    [provider]  naproxen (NAPROSYN) 500 MG tablet Take 1 tablet (500 mg total) by mouth 2 (two) times daily as needed. Patient not taking: Reported on 07/05/2016 06/10/16   Calvert Cantor, MD  nortriptyline (PAMELOR) 25 MG capsule Take 25 mg by mouth at bedtime.    [provider]  omeprazole (PRILOSEC) 20 MG capsule Take 20 mg by mouth 2 (two) times daily before a meal.    [provider]  predniSONE (STERAPRED UNI-PAK 21 TAB) 10 MG (21) TBPK tablet Take 1 tablet (10 mg total) by mouth  daily. Taper by 10 mg daily until finished Patient not taking: Reported on 07/05/2016 06/10/16   Calvert Cantor, MD  sildenafil (VIAGRA) 100 MG tablet Take 100 mg by mouth daily as needed for erectile dysfunction.    [provider]  traMADol (ULTRAM) 50 MG tablet Take 50 mg by mouth 4 (four) times daily as needed. 06/28/16   [provider]    Medications:  I have reviewed the patient's current medications. Scheduled: . [MAR Hold] dexamethasone  8 mg Intravenous Q8H  . [MAR Hold] sodium chloride flush  3 mL Intravenous Q12H   Continuous: . sodium chloride 75 mL/hr at 09/08/18 1039  . [MAR Hold] clindamycin (CLEOCIN) IV 300 mg (09/08/18 1040)  . [MAR Hold] fluconazole (DIFLUCAN) IV       Dg Chest Port 1 View  Result Date: 09/07/2018 CLINICAL DATA:  Difficulty swallowing. Shortness of breath, congestion EXAM: PORTABLE CHEST 1 VIEW COMPARISON:  06/07/2016 FINDINGS: Prior CABG. Cardiomegaly. Increased interstitial markings in the lung bases compatible with scarring, stable. Mild hyperinflation. No acute bony abnormality. IMPRESSION: Hyperinflation. Stable interstitial markings in the lung bases, likely scarring. Mild cardiomegaly. No active disease. Electronically Signed   By: Charlett Nose M.D.   On: 09/07/2018 22:34   Review of Systems:  All other systems reviewed and apart from HPI, are negative.  Blood pressure (!) 146/67, pulse 80, temperature 97.6 F (36.4 C), temperature source Oral, resp. rate 16, height  (1.676 m), weight 70.3 kg, SpO2 99 %. Constitutional: NAD, calm, but stridorous. Eyes: His pupils are equal, round, reactive to light. Extraocular motion is intact.  Ears: Examination of the ears shows normal auricles and external auditory canals bilaterally.  Nose: Nasal examination shows normal mucosa, septum, turbinates.  Face: Facial examination shows no asymmetry. Palpation of the face elicit no significant tenderness.  Mouth: Oral cavity examination  shows no mucosal lacerations. No significant trismus is noted.  Neck Palpation of the neck reveals no lymphadenopathy or mass. The trachea is midline. The thyroid is not significantly enlarged.  Neuro: Cranial nerves 2-12 are all grossly intact. Respiratory: clear to auscultation bilaterally, no wheezing, no crackles. No accessory muscle use.  Musculoskeletal: no clubbing / cyanosis. No joint deformity upper and lower extremities.    Skin: no significant rashes, lesions, ulcers. Warm, dry, well-perfused. Neurologic: CN 2-12 grossly intact. Sensation intact. Strength 5/5 in all 4 limbs.  Psychiatric: Alert and oriented x 3. Calm, cooperative.   Procedure:  Flexible Fiberoptic Laryngoscopy Anesthesia: Topical oxymetazoline Indication: Upper airway obstruction Description: Risks, benefits, and alternatives of flexible endoscopy were explained to the patient. Specific mention was made of the risk of throat numbness with difficulty swallowing, possible bleeding from the nose and mouth, and pain from the procedure.  The patient gave oral consent to proceed.  The nasal cavities were decongested and anesthetised with a combination of oxymetazoline and 4% lidocaine solution.  The flexible  scope was inserted into the right nasal cavity and advanced towards the nasopharynx.  Visualized mucosa over the turbinates and septum were normal.  The nasopharynx was clear.  Oropharyngeal walls were symmetric and mobile without lesion, mass, or edema.  Hypopharynx was also without  lesion or edema.  Larynx was diffusely edematous.  No lesions or asymmetry in the supraglottic larynx.  Both vocal cords were medialized due to the laryngeal edema. A small glottic opening was noted.    Assessment/Plan: Recurrent upper respiratory obstruction secondary to laryngeal edema. The patient is significantly stridorous. - Based on the above findings, the patient will benefit from undergoing tracheostomy tube placement to protect his  airway.  The risk, benefits, alternatives, and details of the procedure are extensively discussed with the patient.  Questions are invited and answered. - The alternative of continuing medical treatment with steroid therapy is also discussed. - The patient would like to proceed with a tracheostomy procedure.  We will schedule the procedure in the operating room under general anesthesia. - Informed consent is obtained.  Priscillia Fouch W Liseth Wann 09/08/2018, 8:13 AM

## 2018-09-09 ENCOUNTER — Encounter (HOSPITAL_COMMUNITY): Payer: Self-pay | Admitting: Otolaryngology

## 2018-09-09 DIAGNOSIS — J386 Stenosis of larynx: Secondary | ICD-10-CM

## 2018-09-09 DIAGNOSIS — J040 Acute laryngitis: Secondary | ICD-10-CM

## 2018-09-09 DIAGNOSIS — J449 Chronic obstructive pulmonary disease, unspecified: Secondary | ICD-10-CM

## 2018-09-09 LAB — GLUCOSE, CAPILLARY
GLUCOSE-CAPILLARY: 118 mg/dL — AB (ref 70–99)
Glucose-Capillary: 127 mg/dL — ABNORMAL HIGH (ref 70–99)
Glucose-Capillary: 125 mg/dL — ABNORMAL HIGH (ref 70–99)
Glucose-Capillary: 113 mg/dL — ABNORMAL HIGH (ref 70–99)
Glucose-Capillary: 113 mg/dL — ABNORMAL HIGH (ref 70–99)

## 2018-09-09 MED ORDER — CARVEDILOL 6.25 MG PO TABS
6.2500 mg | ORAL_TABLET | Freq: Two times a day (BID) | ORAL | Status: DC
  Administered 2018-09-11 – 2018-09-12 (×4): 6.25 mg via ORAL
  Filled 2018-09-09 (×6): qty 1

## 2018-09-09 MED ORDER — SODIUM CHLORIDE 0.9 % IV SOLN
INTRAVENOUS | Status: AC
  Administered 2018-09-09 (×2): via INTRAVENOUS

## 2018-09-09 MED ORDER — CHLORHEXIDINE GLUCONATE CLOTH 2 % EX PADS
6.0000 | MEDICATED_PAD | Freq: Every day | CUTANEOUS | Status: DC
  Administered 2018-09-10 – 2018-09-11 (×2): 6 via TOPICAL

## 2018-09-09 MED ORDER — CARVEDILOL 25 MG PO TABS: 25.0000 mg | ORAL_TABLET | Freq: Two times a day (BID) | ORAL | Status: DC

## 2018-09-09 NOTE — Progress Notes (Addendum)
[facility] PROGRESS NOTE    Progress Note  Clarence Bradshaw  OMB:[id number] DOB: [birth year] DOA: 09/07/2018 PCP: [facility]     Brief Narrative:   Clarence Bradshaw is an 73 y.o. 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  Continue IV dexamethasone and IV clindamycin. Saturations have remained stable.  Management per ENT. Having bloody tinged secretions through tracheostomy tube. Consult speech on 09/10/2018.  Oral thrush: He was previously on a course of steroids and antibiotics, will start IV Diflucan. Keep NPO.  COPD (chronic obstructive pulmonary disease) (HCC): No wheezing no cough, continue albuterol as needed.  Essential hypertension: Blood pressure stable, will start his Coreg at a lower dose. Pressure is holding steady.  Hypovolemic hyponatremia: Likely due to decreased oral intake start on normal saline has now resolved. Continue IV fluids as the patient is n.p.o.  ENT to dictate when to start diet.   DVT prophylaxis: lovenox Family Communication:none Disposition Plan/Barrier to D/C: unable to determine Code Status:     Code Status Orders  (From admission, onward)         Start     Ordered   09/07/18 2212  Full code  Continuous     09/07/18 2213        Code Status History    Date Active Date Inactive Code Status Order ID Comments User Context   07/06/2016 0745 07/06/2016 2004  Full Code [id number]  Houston Siren, MD ED   06/05/2016 2020 06/10/2016 1710 Full Code [id number]  Bobette Mo, MD Inpatient        IV Access:    Peripheral IV   Procedures and diagnostic studies:   Dg Chest Port 1 View  Result Date: 09/07/2018 CLINICAL DATA:  Difficulty swallowing. Shortness of breath, congestion EXAM: PORTABLE CHEST 1 VIEW COMPARISON:  06/07/2016 FINDINGS: Prior CABG. Cardiomegaly. Increased interstitial markings in the lung bases compatible with scarring, stable. Mild hyperinflation. No acute bony abnormality. IMPRESSION: Hyperinflation. Stable interstitial markings in the lung bases, likely scarring. Mild cardiomegaly. No active disease. Electronically Signed   By: Charlett Nose M.D.   On: 09/07/2018 22:34     Medical Consultants:    None.  Anti-Infectives:   IV Diflucan and clindamycin 09/08/2018  Subjective:    Clarence Bradshaw relates his breathing is about the same he still not hungry.  Objective:    Vitals:   09/09/18 0332 09/09/18 0400 09/09/18 0500 09/09/18 0600  BP:  (!) 123/56 122/64 (!) 120/53  Pulse: 60 63 74 72  Resp: 12 13 20 18   Temp:  98.3 F (36.8 C)    TempSrc:  Oral    SpO2: 94% 95% 96% 97%  Weight:      Height:        Intake/Output Summary (Last 24 hours) at 09/09/2018 0759 Last data filed at 09/09/2018 0647 Gross per 24 hour  Intake 1618.88 ml  Output 490 ml  Net 1128.88 ml   Filed Weights   09/07/18 2018 09/08/18 0306 09/09/18 0370  Weight: 72.6 kg 70.3 kg 72.9 kg    Exam: General exam: In no acute distress, which what appears to be stridor Respiratory system: Good air movement and clear to auscultation,he is having bloody stench secretion through tracheostomy tube. Cardiovascular system: S1 & S2 heard, RRR.  Gastrointestinal system: Abdomen is nondistended, soft and nontender.  Central nervous system: Alert and oriented. No focal neurological deficits. Extremities: No pedal edema. Skin: No rashes, lesions or  ulcers. Psychiatry: Judgement and insight appear normal. Mood & affect appropriate.    Data Reviewed:    Labs: Basic Metabolic Panel: Recent Labs  Lab 09/07/18 2036 09/07/18 2041 09/08/18 0257  NA 131* 138 135  K 3.9 4.1 4.5  CL 100  --  108  CO2 19*  --  20*  GLUCOSE 123*  --  164*  BUN 17  --  17  CREATININE 1.17  --  1.24  CALCIUM 8.8*  --  8.9   GFR Estimated Creatinine Clearance: 47.9 mL/min (by C-G formula based on SCr of 1.24 mg/dL). Liver Function Tests: No results for input(s): AST, ALT, ALKPHOS, BILITOT, PROT, ALBUMIN in the last 168 hours. No results for input(s): LIPASE, AMYLASE in the last 168 hours. No results for input(s): AMMONIA in the last 168 hours. Coagulation profile No results for input(s): INR, PROTIME in the last 168 hours.  CBC: Recent Labs  Lab 09/07/18 2036 09/07/18 2041 09/08/18 0257  WBC 15.9*  --  14.0*  NEUTROABS 14.4*  --  12.8*  HGB 12.4* 12.2* 11.9*  HCT 36.9* 36.0* 35.4*  MCV 85.6  --  85.3  PLT 242  --  233   Cardiac Enzymes: No results for input(s): CKTOTAL, CKMB, CKMBINDEX, TROPONINI in the last 168 hours. BNP (last 3 results) No results for input(s): PROBNP in the last 8760 hours. CBG: Recent Labs  Lab 09/08/18 0629 09/08/18 2042 09/09/18 0355  GLUCAP 148* 128* 127*   D-Dimer: No results for input(s): DDIMER in the last 72 hours. Hgb A1c: No results for input(s): HGBA1C in the last 72 hours. Lipid Profile: No results for input(s): CHOL, HDL, LDLCALC, TRIG, CHOLHDL, LDLDIRECT in the last 72 hours. Thyroid function studies: No results for input(s): TSH, T4TOTAL, T3FREE, THYROIDAB in the last 72 hours.  Invalid input(s): FREET3 Anemia work up: No results for input(s): VITAMINB12, FOLATE, FERRITIN, TIBC, IRON, RETICCTPCT in the last 72 hours. Sepsis Labs: Recent Labs  Lab 09/07/18 2036 09/08/18 0257  WBC 15.9* 14.0*   Microbiology Recent Results (from the past 240 hour(s))  Respiratory Panel by PCR      Status: None   Collection Time: 09/07/18  8:37 PM  Result Value Ref Range Status   Adenovirus NOT DETECTED NOT DETECTED Final   Coronavirus 229E NOT DETECTED NOT DETECTED Final    Comment: (NOTE) The Coronavirus on the Respiratory Panel, DOES NOT test for the novel  Coronavirus (2019 nCoV)    Coronavirus HKU1 NOT DETECTED NOT DETECTED Final   Coronavirus NL63 NOT DETECTED NOT DETECTED Final   Coronavirus OC43 NOT DETECTED NOT DETECTED Final   Metapneumovirus NOT DETECTED NOT DETECTED Final   Rhinovirus / Enterovirus NOT DETECTED NOT DETECTED Final   Influenza A NOT DETECTED NOT DETECTED Final   Influenza B NOT DETECTED NOT DETECTED Final   Parainfluenza Virus 1 NOT DETECTED NOT DETECTED Final   Parainfluenza Virus 2 NOT DETECTED NOT DETECTED Final   Parainfluenza Virus 3 NOT DETECTED NOT DETECTED Final   Parainfluenza Virus 4 NOT DETECTED NOT DETECTED Final  Respiratory Syncytial Virus NOT DETECTED NOT DETECTED Final   Bordetella pertussis NOT DETECTED NOT DETECTED Final   Chlamydophila pneumoniae NOT DETECTED NOT DETECTED Final   Mycoplasma pneumoniae NOT DETECTED NOT DETECTED Final    Comment: Performed at [facility], [address] [place], [place] [address]  MRSA PCR Screening     Status: None   Collection Time: 09/08/18  4:50 PM  Result Value Ref Range Status   MRSA by PCR NEGATIVE NEGATIVE Final    Comment:        The GeneXpert MRSA Assay (FDA approved for NASAL specimens only), is one component of a comprehensive MRSA colonization surveillance program. It is not intended to diagnose MRSA infection nor to guide or monitor treatment for MRSA infections. Performed at [facility], [address], [place], [place] [address]      Medications:   . chlorhexidine  15 mL Mouth Rinse BID  . dexamethasone  8 mg Intravenous Q8H  . mouth rinse  15 mL Mouth Rinse q12n4p  . sodium chloride flush  3 mL Intravenous Q12H   Continuous Infusions: . clindamycin  (CLEOCIN) IV Stopped (09/09/18 0243)  . fluconazole (DIFLUCAN) IV        LOS: 1 day   Marinda Elk  [facility]  09/09/2018, 7:59 AM

## 2018-09-09 NOTE — Progress Notes (Signed)
Subjective: Sleeping comfortably and quietly in bed. On trach collar.  Objective: Vital signs in last 24 hours: Temp:  [97.8 F (36.6 C)-98.7 F (37.1 C)] 98.7 F (37.1 C) (03/17 0833) Pulse Rate:  [35-102] 77 (03/17 0833) Resp:  [12-29] 18 (03/17 0833) BP: (110-137)/(51-65) 120/52 (03/17 0833) SpO2:  [92 %-100 %] 98 % (03/17 0833) FiO2 (%):  [28 %] 28 % (03/17 0833) Weight:  [72.9 kg] 72.9 kg (03/17 0213)  Constitutional:Resting comfortably. NAD. Ears: Examination of the ears shows normal auricles and external auditory canals bilaterally.  Nose: Nasal examination shows normal mucosa, septum, turbinates.  Neck: Trach in place, with good air movement. No bleeding. Respiratory:clear to auscultation bilaterally, no wheezing, no crackles. No accessory muscle use.  Musculoskeletal:no clubbing / cyanosis. No joint deformity upper and lower extremities.  Skin:no significant rashes, lesions, ulcers. Warm, dry, well-perfused.  Recent Labs    09/07/18 2036 09/07/18 2041 09/08/18 0257  WBC 15.9*  --  14.0*  HGB 12.4* 12.2* 11.9*  HCT 36.9* 36.0* 35.4*  PLT 242  --  233   Recent Labs    09/07/18 2036 09/07/18 2041 09/08/18 0257  NA 131* 138 135  K 3.9 4.1 4.5  CL 100  --  108  CO2 19*  --  20*  GLUCOSE 123*  --  164*  BUN 17  --  17  CREATININE 1.17  --  1.24  CALCIUM 8.8*  --  8.9    Medications:  I have reviewed the patient's current medications. Scheduled: . carvedilol  6.25 mg Oral BID WC  . chlorhexidine  15 mL Mouth Rinse BID  . dexamethasone  8 mg Intravenous Q8H  . mouth rinse  15 mL Mouth Rinse q12n4p  . sodium chloride flush  3 mL Intravenous Q12H   Continuous: . sodium chloride 100 mL/hr at 09/09/18 0854  . clindamycin (CLEOCIN) IV Stopped (09/09/18 0243)  . fluconazole (DIFLUCAN) IV 100 mg (09/09/18 0854)    Assessment/Plan: POD #1 s/p tracheostomy to treat his significant upper airway obstruction. - Doing well on trach collar. - Swallowing/PMV  evaluation per speech - Will change to uncuffed trach in approximately 2 days.   LOS: 1 day   Lamarcus Spira W Chisom Aust 09/09/2018, 9:22 AM

## 2018-09-09 NOTE — Progress Notes (Signed)
Patient expressed concerns regarding his tracheostomy through written word. "Doc did not tell me I would be in worse shape. All this was not in my plans. If I would have known, NO".   Patient visibly irritable and upset. Patient continuously coughing and complaining of throat irritation. RN reassured patient that this is normal and expected at this time. Patient suctioned, inner cannula changed, trach care performed. Patient repositioned in bed.

## 2018-09-10 DIAGNOSIS — J386 Stenosis of larynx: Secondary | ICD-10-CM

## 2018-09-10 DIAGNOSIS — J438 Other emphysema: Secondary | ICD-10-CM

## 2018-09-10 DIAGNOSIS — J449 Chronic obstructive pulmonary disease, unspecified: Secondary | ICD-10-CM

## 2018-09-10 DIAGNOSIS — J040 Acute laryngitis: Secondary | ICD-10-CM

## 2018-09-10 LAB — GLUCOSE, CAPILLARY
Glucose-Capillary: 117 mg/dL — ABNORMAL HIGH (ref 70–99)
Glucose-Capillary: 117 mg/dL — ABNORMAL HIGH (ref 70–99)
Glucose-Capillary: 118 mg/dL — ABNORMAL HIGH (ref 70–99)
Glucose-Capillary: 119 mg/dL — ABNORMAL HIGH (ref 70–99)
Glucose-Capillary: 120 mg/dL — ABNORMAL HIGH (ref 70–99)
Glucose-Capillary: 121 mg/dL — ABNORMAL HIGH (ref 70–99)
Glucose-Capillary: 122 mg/dL — ABNORMAL HIGH (ref 70–99)

## 2018-09-10 MED ORDER — LIDOCAINE 5 % EX PTCH
1.0000 | MEDICATED_PATCH | CUTANEOUS | Status: DC
  Administered 2018-09-10 – 2018-09-11 (×2): 1 via TRANSDERMAL
  Filled 2018-09-10 (×3): qty 1

## 2018-09-10 NOTE — Evaluation (Signed)
Passy-Muir Speaking Valve - Evaluation Patient Details  Name: Clarence Bradshaw MRN: [id number] Date of Birth: Mar 30, 1946  Today's Date: 09/10/2018 Time: 4235-3614 SLP Time Calculation (min) (ACUTE ONLY): 22 min  Past Medical History:  Past Medical History:  Diagnosis Date  . COPD (chronic obstructive pulmonary disease) (HCC)   . Hypertension    Past Surgical History:  Past Surgical History:  Procedure Laterality Date  . BACK SURGERY    . CARDIAC SURGERY    . CORONARY ARTERY BYPASS GRAFT     x3  . PERCUTANEOUS PLACEMENT INTRAVASCULAR STENT CERVICAL CAROTID ARTERY    . TRACHEOSTOMY TUBE PLACEMENT N/A 09/08/2018   Procedure: TRACHEOSTOMY;  Surgeon: Newman Pies, MD;  Location: [facility] OR;  Service: ENT;  Laterality: N/A;   HPI:  Clarence Bradshaw is a 73 y.o. male with medical history significant for COPD and hypertension, presenting to the emergency department with shortness of breath and pain with swallowing.  Per MD note patient admitted in December 2017 with supraglottic airway stenosis secondary to laryngeal swelling and made a full recovery per ptreport, but over the past 2 months, has developed recurrent shortness of breath and odynophagia. CT that demonstrated high-grade/critical stenosis of the supraglottic larynx with generalized supraglottic soft tissue thickening, almost identical to the scan from December 2017. ENT note reported reccurrent upper respiratory obstruction secondary to laryngeal edema> trach performed 3/16. CXR 3/15 No active disease. No prior ST notes found.    Assessment / Plan / Recommendation Clinical Impression  Pt is cognitively intact, alert and very engaged in assessment with Passy-Muir speaking valve. Cuff present and deflated on arrival. Finger occlusion effective to produce strong voice. Multiple placements of valve initially due to back pressure of exhaled air. After approximately 2 minutes his vocal quality was strained, face reddened and audible burst of air present  when valve doffed. Vitals remained in normal range. Pt should have an increased opportunity for valve useage for longer duration once trach is changed to cuffless (MD plans for tomorrow). Recommend pt wear PMV with SLP only and continue to follow up on status and intervention.       SLP Visit Diagnosis: Aphonia (R49.1)    SLP Assessment  Patient needs continued Speech Lanaguage Pathology Services    Follow Up Recommendations  Other (comment)(TBD)    Frequency and Duration min 2x/week  2 weeks    PMSV Trial PMSV was placed for: interval up to 2 min max Able to redirect subglottic air through upper airway: Yes Able to Attain Phonation: Yes Voice Quality: Other (comment)(strained quality) Able to Expectorate Secretions: Yes Level of Secretion Expectoration with PMSV: Tracheal Breath Support for Phonation: Mildly decreased Intelligibility: Intelligible Respirations During Trial: (under 30) SpO2 During Trial: 100 % Pulse During Trial: 75 Behavior: Alert;Controlled;Cooperative;Expresses self well;Good eye contact;Responsive to questions   Tracheostomy Tube       Vent Dependency  FiO2 (%): 28 %    Cuff Deflation Trial  GO Tolerated Cuff Deflation: (deflated on arrival) Behavior: Alert;Controlled;Cooperative;Expresses self well;Good eye contact;Responsive to questions        Clarence Bradshaw 09/10/2018, 11:16 AM   Clarence Bradshaw Lonell Face.Ed Nurse, children's [phone] Office [phone]

## 2018-09-10 NOTE — Progress Notes (Signed)
Subjective: Resting comfortably in bed. No complaint. On trach collar.  Objective: Vital signs in last 24 hours: Temp:  [98.1 F (36.7 C)-99.2 F (37.3 C)] 99.2 F (37.3 C) (03/18 0811) Pulse Rate:  [36-106] 70 (03/18 0800) Resp:  [12-23] 15 (03/18 0800) BP: (105-144)/(47-104) 132/58 (03/18 0800) SpO2:  [89 %-97 %] 93 % (03/18 0800) FiO2 (%):  [28 %] 28 % (03/18 0729) Weight:  [72.3 kg] 72.3 kg (03/18 0500)  Constitutional:NAD, calm Eyes: His pupils are equal, round, reactive to light. Extraocular motion is intact.  Ears: Examination of the ears shows normal auricles and external auditory canals bilaterally.  Nose: Nasal examination shows normal mucosa, septum, turbinates.  Face: Facial examination shows no asymmetry. Palpation of the face elicit no significant tenderness.  Mouth: Oral cavity examination shows no mucosal lacerations. No significant trismus is noted.  Neck: Trach midline with good air movement. No bleeding. Neuro: Cranial nerves 2-12 are all grossly intact. Respiratory:clear to auscultation bilaterally, no wheezing, no crackles. No accessory muscle use.  Musculoskeletal:no clubbing / cyanosis. No joint deformity upper and lower extremities.  Skin:no significant rashes, lesions, ulcers. Warm, dry, well-perfused. Psychiatric:Alert and oriented x 3. Calm, cooperative.    Recent Labs    09/07/18 2036 09/07/18 2041 09/08/18 0257  WBC 15.9*  --  14.0*  HGB 12.4* 12.2* 11.9*  HCT 36.9* 36.0* 35.4*  PLT 242  --  233   Recent Labs    09/07/18 2036 09/07/18 2041 09/08/18 0257  NA 131* 138 135  K 3.9 4.1 4.5  CL 100  --  108  CO2 19*  --  20*  GLUCOSE 123*  --  164*  BUN 17  --  17  CREATININE 1.17  --  1.24  CALCIUM 8.8*  --  8.9    Medications:  I have reviewed the patient's current medications. Scheduled: . carvedilol  6.25 mg Oral BID WC  . chlorhexidine  15 mL Mouth Rinse BID  . Chlorhexidine Gluconate Cloth  6 each Topical Daily  .  dexamethasone  8 mg Intravenous Q8H  . mouth rinse  15 mL Mouth Rinse q12n4p  . sodium chloride flush  3 mL Intravenous Q12H   Continuous: . clindamycin (CLEOCIN) IV Stopped (09/10/18 0340)  . fluconazole (DIFLUCAN) IV 100 mg (09/10/18 0846)    Assessment/Plan: POD #2 s/p tracheostomy to treat his significant recurrent upper airway obstruction. - Doing well on trach collar. - Swallowing/PMV evaluation per speech. Advance diet as tolerated. - Will change to uncuffed trach tomorrow. Janina Mayo care teaching.   LOS: 2 days   Clarence Bradshaw 09/10/2018, 9:16 AM

## 2018-09-10 NOTE — Evaluation (Signed)
Clinical/Bedside Swallow Evaluation Patient Details  Name: Clarence Bradshaw MRN: [id number] Date of Birth: 03-21-1946  Today's Date: 09/10/2018 Time: SLP Start Time (ACUTE ONLY): 2703 SLP Stop Time (ACUTE ONLY): 1008 SLP Time Calculation (min) (ACUTE ONLY): 10 min  Past Medical History:  Past Medical History:  Diagnosis Date  . COPD (chronic obstructive pulmonary disease) (HCC)   . Hypertension    Past Surgical History:  Past Surgical History:  Procedure Laterality Date  . BACK SURGERY    . CARDIAC SURGERY    . CORONARY ARTERY BYPASS GRAFT     x3  . PERCUTANEOUS PLACEMENT INTRAVASCULAR STENT CERVICAL CAROTID ARTERY    . TRACHEOSTOMY TUBE PLACEMENT N/A 09/08/2018   Procedure: TRACHEOSTOMY;  Surgeon: Newman Pies, MD;  Location: [facility] OR;  Service: ENT;  Laterality: N/A;   HPI:  Clarence Bradshaw is a 73 y.o. male with medical history significant for COPD and hypertension, presenting to the emergency department with shortness of breath and pain with swallowing.  Per MD note patient admitted in December 2017 with supraglottic airway stenosis secondary to laryngeal swelling and made a full recovery per ptreport, but over the past 2 months, has developed recurrent shortness of breath and odynophagia. CT that demonstrated high-grade/critical stenosis of the supraglottic larynx with generalized supraglottic soft tissue thickening, almost identical to the scan from December 2017. ENT note reported reccurrent upper respiratory obstruction secondary to laryngeal edema> trach performed 3/16. CXR 3/15 No active disease. No prior ST notes found.    Assessment / Plan / Recommendation Clinical Impression  Pt was unable to safely wear Passy-Muir speaking valve for greater than 2 minute increments, therefore swallow assessment performed without valve in place. Oral manipulation was unremarkable with thin and puree textures. Suspicion of compromised airway given delayed coughs and wet vocal quality during SLP finger  occlusion. Probable laryngeal/pharyngeal edema in conjuction with patent respiratory system with trach therefore, recommend continue NPO except intermittent ice chips, sips water with supervision after oral care. ST will observe pt with speaking valve following trach change to cuffless with hopeful increased tolerance of valve and airway protection. Will determine ability to start po's or proceed with instrumental assessment.     SLP Visit Diagnosis: Dysphagia, unspecified (R13.10)    Aspiration Risk  Moderate aspiration risk    Diet Recommendation Other (Comment)(NPO except ice chips, sips water)   Medication Administration: Crushed with puree(crushed in puree if necessary)    Other  Recommendations Oral Care Recommendations: Oral care QID;Oral care prior to ice chip/H20   Follow up Recommendations (TBD)      Frequency and Duration min 2x/week  2 weeks       Prognosis Prognosis for Safe Diet Advancement: Good      Swallow Study   General HPI: Clarence Bradshaw is a 73 y.o. male with medical history significant for COPD and hypertension, presenting to the emergency department with shortness of breath and pain with swallowing.  Per MD note patient admitted in December 2017 with supraglottic airway stenosis secondary to laryngeal swelling and made a full recovery per ptreport, but over the past 2 months, has developed recurrent shortness of breath and odynophagia. CT that demonstrated high-grade/critical stenosis of the supraglottic larynx with generalized supraglottic soft tissue thickening, almost identical to the scan from December 2017. ENT note reported reccurrent upper respiratory obstruction secondary to laryngeal edema> trach performed 3/16. CXR 3/15 No active disease. No prior ST notes found.  Type of Study: Bedside Swallow Evaluation Previous Swallow Assessment: none Diet Prior to  this Study: NPO Temperature Spikes Noted: Yes Respiratory Status: Trach;Trach Collar Trach Size and  Type: #6;Cuff;Deflated;With PMSV not in place(tolerates up to 2 min) History of Recent Intubation: Yes(intubated then immediate trach in OR) Length of Intubations (days): (intubated then immediate trach in OR) Date extubated: (trach 3/16) Behavior/Cognition: Alert;Cooperative;Pleasant mood Oral Cavity Assessment: Other (comment)(lingual candidias) Oral Care Completed by SLP: Yes Oral Cavity - Dentition: Edentulous Vision: Functional for self-feeding Self-Feeding Abilities: Able to feed self Patient Positioning: Upright in chair Baseline Vocal Quality: (aphonic>trach, dec tolerance pmv) Volitional Cough: Strong Volitional Swallow: Able to elicit    Oral/Motor/Sensory Function Overall Oral Motor/Sensory Function: Within functional limits   Ice Chips Ice chips: Within functional limits Presentation: Spoon   Thin Liquid Thin Liquid: Impaired Presentation: Cup Oral Phase Impairments: Other (comment)(WNL) Oral Phase Functional Implications: Other (comment)(WNL) Pharyngeal  Phase Impairments: Wet Vocal Quality;Cough - Delayed    Nectar Thick Nectar Thick Liquid: Not tested   Honey Thick Honey Thick Liquid: Not tested   Puree Puree: Impaired Presentation: Spoon;Self Fed Oral Phase Impairments: (WNL) Oral Phase Functional Implications: (WNL) Pharyngeal Phase Impairments: Cough - Delayed   Solid     Solid: Not tested      [name] 09/10/2018,11:43 AM  Breck Coons Lonell Face.Ed Nurse, children's [phone] Office [phone]

## 2018-09-10 NOTE — Progress Notes (Signed)
[facility] PROGRESS NOTE    Progress Note  Clarence Bradshaw  RUE:[id number] DOB: 1946/03/06 DOA: 09/07/2018 PCP: [facility]     Brief Narrative:   Clarence Bradshaw is an 73 y.o. male past medical history significant for COPD, hypertension, admitted in December 2017 for supraglottis airway stenosis with laryngeal swelling suspected to be a viral etiology comes in for shortness of breath and odynophagia CT scan showed high-grade critical stenosis of the supraglottic larynx and soft tissue thickening almost identical compared to December 2017, there was no ENT coverage at the initial facility he was given 10 mg of IV Decadron and transferred to [facility].  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  Continue IV dexamethasone and IV clindamycin. Saturations have remained stable.  Management per ENT. Having bloody tinged secretions through tracheostomy tube. Consult speech on 09/10/2018.  Oral thrush: He was previously on a course of steroids and antibiotics, will start IV Diflucan. Keep NPO.  COPD (chronic obstructive pulmonary disease) (HCC): No wheezing no cough, continue albuterol as needed.  Essential hypertension: Blood pressure stable, will start his Coreg at a lower dose. Pressure is holding steady.  Hypovolemic hyponatremia: Likely due to decreased oral intake start on normal saline has now resolved. Continue IV fluids as the patient is n.p.o.  ENT to dictate when to start diet.   DVT prophylaxis: lovenox Family Communication:none Disposition Plan/Barrier to D/C: unable to determine, needs ENT clearance Code Status:     Code Status Orders  (From admission, onward)         Start     Ordered   09/07/18 2212  Full code  Continuous     09/07/18 2213        Code Status History    Date Active Date Inactive Code Status Order ID Comments User Context   07/06/2016 0745 07/06/2016 2004 Full Code [id number]  Houston Siren, MD ED   06/05/2016 2020 06/10/2016 1710 Full Code [id number]  Bobette Mo, MD Inpatient        IV Access:    Peripheral IV   Procedures and diagnostic studies:   No results found.   Medical Consultants:    None.  Anti-Infectives:   IV Diflucan and clindamycin 09/08/2018  Subjective:    Clarence Bradshaw  Reports chronic back pain, he is ambulating in room, very pleasant, communicate with writing He is able to clear his own secretion, only requires suction occasionally per RN  Objective:    Vitals:   09/10/18 1558 09/10/18 1600 09/10/18 1700 09/10/18 1800  BP:  139/60 127/63 (!) 122/110  Pulse:  64 69   Resp:  Temp: 98.6 F (37 C)     TempSrc: Oral     SpO2:  97% 96%   Weight:      Height:        Intake/Output Summary (Last 24 hours) at 09/10/2018 1826 Last data filed at 09/10/2018 1800 Gross per 24 hour  Intake 1733.03 ml  Output 950 ml  Net 783.03 ml   Filed Weights   09/08/18 0306 09/09/18 0213 09/10/18 0500  Weight: 70.3 kg 72.9 kg 72.3 kg    Exam: General exam: In no acute distress, which what appears to be stridor Respiratory system: Good air movement and clear to auscultation,he is having bloody stench secretion through tracheostomy tube. Cardiovascular system: S1 & S2 heard, RRR.  Gastrointestinal system: Abdomen is nondistended, soft and nontender.  Central nervous system: Alert and oriented. No focal neurological deficits. Extremities: No pedal edema. Skin: No rashes, lesions or ulcers. Psychiatry: Judgement and insight appear normal. Mood & affect appropriate.    Data Reviewed:    Labs: Basic Metabolic Panel: Recent Labs  Lab 09/07/18 2036 09/07/18 2041 09/08/18 0257  NA 131* 138 135  K 3.9 4.1 4.5  CL 100  --  108  CO2 19*  --  20*  GLUCOSE 123*  --  164*  BUN 17  --  17  CREATININE 1.17  --  1.24  CALCIUM 8.8*  --  8.9   GFR Estimated  Creatinine Clearance: 47.9 mL/min (by C-G formula based on SCr of 1.24 mg/dL). Liver Function Tests: No results for input(s): AST, ALT, ALKPHOS, BILITOT, PROT, ALBUMIN in the last 168 hours. No results for input(s): LIPASE, AMYLASE in the last 168 hours. No results for input(s): AMMONIA in the last 168 hours. Coagulation profile No results for input(s): INR, PROTIME in the last 168 hours.  CBC: Recent Labs  Lab 09/07/18 2036 09/07/18 2041 09/08/18 0257  WBC 15.9*  --  14.0*  NEUTROABS 14.4*  --  12.8*  HGB 12.4* 12.2* 11.9*  HCT 36.9* 36.0* 35.4*  MCV 85.6  --  85.3  PLT 242  --  233   Cardiac Enzymes: No results for input(s): CKTOTAL, CKMB, CKMBINDEX, TROPONINI in the last 168 hours. BNP (last 3 results) No results for input(s): PROBNP in the last 8760 hours. CBG: Recent Labs  Lab 09/10/18 0006 09/10/18 0415 09/10/18 0811 09/10/18 1120 09/10/18 1542  GLUCAP 117* 121* 118* 119* 117*   D-Dimer: No results for input(s): DDIMER in the last 72 hours. Hgb A1c: No results for input(s): HGBA1C in the last 72 hours. Lipid Profile: No results for input(s): CHOL, HDL, LDLCALC, TRIG, CHOLHDL, LDLDIRECT in the last 72 hours. Thyroid function studies: No results for input(s): TSH, T4TOTAL, T3FREE, THYROIDAB in the last 72 hours.  Invalid input(s): FREET3 Anemia work up: No results for input(s): VITAMINB12, FOLATE, FERRITIN, TIBC, IRON, RETICCTPCT in the last 72 hours. Sepsis Labs: Recent Labs  Lab 09/07/18 2036 09/08/18 0257  WBC 15.9* 14.0*   Microbiology Recent Results (from the past 240 hour(s))  Respiratory Panel by PCR     Status: None   Collection Time: 09/07/18  8:37 PM  Result Value Ref Range Status   Adenovirus NOT DETECTED NOT DETECTED Final   Coronavirus 229E NOT DETECTED NOT DETECTED Final    Comment: (NOTE) The Coronavirus on the Respiratory Panel, DOES NOT test for the novel  Coronavirus (2019 nCoV)    Coronavirus HKU1 NOT DETECTED NOT DETECTED Final    Coronavirus NL63 NOT DETECTED NOT DETECTED Final   Coronavirus OC43 NOT DETECTED NOT DETECTED Final   Metapneumovirus NOT DETECTED NOT DETECTED Final   Rhinovirus / Enterovirus NOT DETECTED NOT DETECTED Final   Influenza A NOT DETECTED NOT DETECTED Final   Influenza B NOT DETECTED NOT DETECTED Final   Parainfluenza Virus 1 NOT DETECTED NOT DETECTED Final   Parainfluenza Virus 2 NOT DETECTED NOT DETECTED Final   Parainfluenza Virus 3 NOT DETECTED NOT DETECTED Final   Parainfluenza Virus 4 NOT DETECTED NOT DETECTED Final   Respiratory Syncytial Virus NOT DETECTED NOT DETECTED Final   Bordetella pertussis NOT DETECTED NOT DETECTED Final   Chlamydophila pneumoniae NOT DETECTED NOT DETECTED Final   Mycoplasma pneumoniae NOT DETECTED NOT DETECTED Final    Comment: Performed at [facility], [address]  [place], [place] [address]  MRSA PCR Screening     Status: None   Collection Time: 09/08/18  4:50 PM  Result Value Ref Range Status   MRSA by PCR NEGATIVE NEGATIVE Final    Comment:        The GeneXpert MRSA Assay (FDA approved for NASAL specimens only), is one component of a comprehensive MRSA colonization surveillance program. It is not intended to diagnose MRSA infection nor to guide or monitor treatment for MRSA infections. Performed at [facility], [address], [place], [place] [address]      Medications:   . carvedilol  6.25 mg Oral BID WC  . chlorhexidine  15 mL Mouth Rinse BID  . Chlorhexidine Gluconate Cloth  6 each Topical Daily  . dexamethasone  8 mg Intravenous Q8H  . mouth rinse  15 mL Mouth Rinse q12n4p  . sodium chloride flush  3 mL Intravenous Q12H   Continuous Infusions: . clindamycin (CLEOCIN) IV 300 mg (09/10/18 1807)  . fluconazole (DIFLUCAN) IV Stopped (09/10/18 0946)      LOS: 2 days   Albertine Grates  [facility]  09/10/2018, 6:26 PM

## 2018-09-11 DIAGNOSIS — J386 Stenosis of larynx: Secondary | ICD-10-CM

## 2018-09-11 DIAGNOSIS — J449 Chronic obstructive pulmonary disease, unspecified: Secondary | ICD-10-CM

## 2018-09-11 DIAGNOSIS — I5022 Chronic systolic (congestive) heart failure: Secondary | ICD-10-CM

## 2018-09-11 DIAGNOSIS — J040 Acute laryngitis: Secondary | ICD-10-CM

## 2018-09-11 DIAGNOSIS — F17200 Nicotine dependence, unspecified, uncomplicated: Secondary | ICD-10-CM

## 2018-09-11 LAB — BASIC METABOLIC PANEL
ANION GAP: 8 (ref 5–15)
BUN: 34 mg/dL — ABNORMAL HIGH (ref 8–23)
CO2: 21 mmol/L — ABNORMAL LOW (ref 22–32)
Calcium: 8.4 mg/dL — ABNORMAL LOW (ref 8.9–10.3)
Chloride: 110 mmol/L (ref 98–111)
Creatinine, Ser: 0.9 mg/dL (ref 0.61–1.24)
GFR calc Af Amer: >60 mL/min (ref >60)
GFR calc non Af Amer: >60 mL/min (ref >60)
Glucose, Bld: 134 mg/dL — ABNORMAL HIGH (ref 70–99)
Potassium: 3.8 mmol/L (ref 3.5–5.1)
Sodium: 139 mmol/L (ref 135–145)

## 2018-09-11 LAB — CBC WITH DIFFERENTIAL/PLATELET
Abs Immature Granulocytes: 0.32 10*3/uL — ABNORMAL HIGH (ref 0.00–0.07)
BASOS ABS: 0 10*3/uL (ref 0.0–0.1)
Basophils Relative: 0 %
Eosinophils Absolute: 0 10*3/uL (ref 0.0–0.5)
Eosinophils Relative: 0 %
HEMATOCRIT: 33.4 % — AB (ref 39.0–52.0)
Hemoglobin: 10.9 g/dL — ABNORMAL LOW (ref 13.0–17.0)
IMMATURE GRANULOCYTES: 2 %
Lymphocytes Relative: 6 %
Lymphs Abs: 0.9 10*3/uL (ref 0.7–4.0)
MCH: 28 pg (ref 26.0–34.0)
MCHC: 32.6 g/dL (ref 30.0–36.0)
MCV: 85.9 fL (ref 80.0–100.0)
Monocytes Absolute: 0.6 10*3/uL (ref 0.1–1.0)
Monocytes Relative: 4 %
NEUTROS PCT: 88 %
NRBC: 0 % (ref 0.0–0.2)
Neutro Abs: 13.7 10*3/uL — ABNORMAL HIGH (ref 1.7–7.7)
Platelets: 234 10*3/uL (ref 150–400)
RBC: 3.89 MIL/uL — ABNORMAL LOW (ref 4.22–5.81)
RDW: 14.6 % (ref 11.5–15.5)
WBC: 15.5 10*3/uL — ABNORMAL HIGH (ref 4.0–10.5)

## 2018-09-11 LAB — GLUCOSE, CAPILLARY
GLUCOSE-CAPILLARY: 125 mg/dL — AB (ref 70–99)
Glucose-Capillary: 126 mg/dL — ABNORMAL HIGH (ref 70–99)
Glucose-Capillary: 126 mg/dL — ABNORMAL HIGH (ref 70–99)
Glucose-Capillary: 145 mg/dL — ABNORMAL HIGH (ref 70–99)
Glucose-Capillary: 130 mg/dL — ABNORMAL HIGH (ref 70–99)

## 2018-09-11 LAB — HEPATIC FUNCTION PANEL
ALT: 18 U/L (ref 0–44)
AST: 25 U/L (ref 15–41)
Albumin: 3.5 g/dL (ref 3.5–5.0)
Alkaline Phosphatase: 42 U/L (ref 38–126)
BILIRUBIN TOTAL: 0.7 mg/dL (ref 0.3–1.2)
Bilirubin, Direct: 0.2 mg/dL (ref 0.0–0.2)
Indirect Bilirubin: 0.5 mg/dL (ref 0.3–0.9)
Total Protein: 6.5 g/dL (ref 6.5–8.1)

## 2018-09-11 LAB — MAGNESIUM: Magnesium: 2.4 mg/dL (ref 1.7–2.4)

## 2018-09-11 MED ORDER — FLUCONAZOLE 100 MG PO TABS
100.0000 mg | ORAL_TABLET | Freq: Every day | ORAL | Status: DC
  Administered 2018-09-12: 100 mg via ORAL
  Filled 2018-09-11: qty 1

## 2018-09-11 MED ORDER — NYSTATIN 100000 UNIT/ML MT SUSP
5.0000 mL | Freq: Four times a day (QID) | OROMUCOSAL | Status: DC
  Administered 2018-09-11 – 2018-09-12 (×6): 500000 [IU] via ORAL
  Filled 2018-09-11 (×7): qty 5

## 2018-09-11 NOTE — Progress Notes (Signed)
[facility] PROGRESS NOTE    Progress Note  Clarence Bradshaw Hazzard  ZOX:[id number] DOB: 06/09/1946 DOA: 09/07/2018 [facility]     Brief Narrative:   Clarence Bradshaw [place] is an 73 y.o. male past medical history significant for COPD, hypertension, admitted in December 2017 for supraglottis airway stenosis with laryngeal swelling suspected to be a viral etiology comes in for shortness of breath and odynophagia CT scan showed high-grade critical stenosis of the supraglottic larynx and soft tissue thickening almost identical compared to December 2017, there was no ENT coverage at the initial facility he was given 10 mg of IV Decadron and [facility].  Assessment/Plan:   Supraglottic stenosis/ Laryngitis: Virus panel was negative.  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 Case discussed with ENT Dr Suszanne Connerseoh who recommended d/c steroids and clindamycin Patient is doing well tolerating passy-muir valve, speech cleared him to regular diet Per Dr Suszanne Connerseoh, patient can discharge home as long as home health is set up for trach care/devices. Patient is to follow up with Dr Suszanne Connerseoh in a few weeks Case manager to set up home health    Oral thrush: He was previously on a course of steroids and antibiotics, he is treated with IV Diflucan. Now able to take oral meds, change to oral, add on topical nystatin  COPD (chronic obstructive pulmonary disease) (HCC): not o2 dependent,  No wheezing , lung clear on exam continue home meds. Advise quit smoking  Essential hypertension: Blood pressure stable, will start his Coreg at a lower dose. Pressure is holding steady.  Chronic systolic CHF -last EF 45-50% from 2017 -euvolemic on exam  Hypovolemic hyponatremia: Likely due to decreased oral intake start on normal saline has now resolved. Started back on regular diet today  H/o Carotid artery stenosis - h/o right carotid  endarterectomy - Left ICA "waveform is suggestive of intracranial stenosis, in addition to the cervical stenosis." -advise quit smoking   Tobacco use disorder - counseled to quit I have discussed tobacco cessation with the patient.  I have counseled the patient regarding the negative impacts of continued tobacco use including but not limited to lung cancer, COPD, and cardiovascular disease.  I have discussed alternatives to tobacco and modalities that may help facilitate tobacco cessation including but not limited to biofeedback, hypnosis, and medications.  Total time spent with tobacco counseling was 4 minutes.   DVT prophylaxis: lovenox Family Communication:none Disposition Plan/Barrier to D/C: ENT cleared him to d/c home once home health set up.  Code Status:     Code Status Orders  (From admission, onward)         Start     Ordered   09/07/18 2212  Full code  Continuous     09/07/18 2213        Code Status History    Date Active Date Inactive Code Status Order ID Comments User Context   07/06/2016 0745 07/06/2016 2004 Full Code [id number]  Houston SirenLe, Peter, MD ED   06/05/2016 2020 06/10/2016 1710 Full Code [id number]  Bobette Mortiz, David Manuel, MD Inpatient        IV Access:    Peripheral IV   Procedures and diagnostic studies:   No results found.   Medical Consultants:    None.  Anti-Infectives:   IV Diflucan and clindamycin 09/08/2018  Subjective:    Clarence Personichard Buhl   he is ambulating in room, very pleasant,  He has passy muir valve on , he  is happy that he can talk and he can eat   Objective:    Vitals:   09/11/18 1100 09/11/18 1109 09/11/18 1140 09/11/18 1200  BP: (!) 144/101 (!) 156/140  (!) 129/55  Pulse: 70 82  67  Resp:  17  17  Temp:   97.9 F (36.6 C)   TempSrc:   Oral   SpO2: 100% 100%  96%  Weight:      Height:        Intake/Output Summary (Last 24 hours) at 09/11/2018 1351 Last data filed at 09/11/2018 0252 Gross per 24 hour  Intake 50 ml   Output 800 ml  Net -750 ml   Filed Weights   09/08/18 0306 09/09/18 0213 09/10/18 0500  Weight: 70.3 kg 72.9 kg 72.3 kg    Exam: General exam: In no acute distress,  Respiratory system: Good air movement and clear to auscultation, Cardiovascular system: S1 & S2 heard, RRR.  Gastrointestinal system: Abdomen is nondistended, soft and nontender.  Central nervous system: Alert and oriented. No focal neurological deficits. Extremities: No pedal edema. Skin: No rashes, lesions or ulcers. Psychiatry: Judgement and insight appear normal. Mood & affect appropriate.    Data Reviewed:    Labs: Basic Metabolic Panel: Recent Labs  Lab 09/07/18 2036 09/07/18 2041 09/08/18 0257 09/11/18 0238  NA 131* 138 135 139  K 3.9 4.1 4.5 3.8  CL 100  --  108 110  CO2 19*  --  20* 21*  GLUCOSE 123*  --  164* 134*  BUN 17  --  17 34*  CREATININE 1.17  --  1.24 0.90  CALCIUM 8.8*  --  8.9 8.4*  MG  --   --   --  2.4   GFR Estimated Creatinine Clearance: 66 mL/min (by C-G formula based on SCr of 0.9 mg/dL). Liver Function Tests: Recent Labs  Lab 09/11/18 0238  AST 25  ALT 18  ALKPHOS 42  BILITOT 0.7  PROT 6.5  ALBUMIN 3.5   No results for input(s): LIPASE, AMYLASE in the last 168 hours. No results for input(s): AMMONIA in the last 168 hours. Coagulation profile No results for input(s): INR, PROTIME in the last 168 hours.  CBC: Recent Labs  Lab 09/07/18 2036 09/07/18 2041 09/08/18 0257 09/11/18 0238  WBC 15.9*  --  14.0* 15.5*  NEUTROABS 14.4*  --  12.8* 13.7*  HGB 12.4* 12.2* 11.9* 10.9*  HCT 36.9* 36.0* 35.4* 33.4*  MCV 85.6  --  85.3 85.9  PLT 242  --  233 234   Cardiac Enzymes: No results for input(s): CKTOTAL, CKMB, CKMBINDEX, TROPONINI in the last 168 hours. BNP (last 3 results) No results for input(s): PROBNP in the last 8760 hours. CBG: Recent Labs  Lab 09/10/18 2034 09/10/18 2352 09/11/18 0427 09/11/18 0819 09/11/18 1135  GLUCAP 122* 120* 126* 126* 145*    D-Dimer: No results for input(s): DDIMER in the last 72 hours. Hgb A1c: No results for input(s): HGBA1C in the last 72 hours. Lipid Profile: No results for input(s): CHOL, HDL, LDLCALC, TRIG, CHOLHDL, LDLDIRECT in the last 72 hours. Thyroid function studies: No results for input(s): TSH, T4TOTAL, T3FREE, THYROIDAB in the last 72 hours.  Invalid input(s): FREET3 Anemia work up: No results for input(s): VITAMINB12, FOLATE, FERRITIN, TIBC, IRON, RETICCTPCT in the last 72 hours. Sepsis Labs: Recent Labs  Lab 09/07/18 2036 09/08/18 0257 09/11/18 0238  WBC 15.9* 14.0* 15.5*   Microbiology Recent Results (from the past 240 hour(s))  Respiratory Panel  by PCR     Status: None   Collection Time: 09/07/18  8:37 PM  Result Value Ref Range Status   Adenovirus NOT DETECTED NOT DETECTED Final   Coronavirus 229E NOT DETECTED NOT DETECTED Final    Comment: (NOTE) The Coronavirus on the Respiratory Panel, DOES NOT test for the novel  Coronavirus (2019 nCoV)    Coronavirus HKU1 NOT DETECTED NOT DETECTED Final   Coronavirus NL63 NOT DETECTED NOT DETECTED Final   Coronavirus OC43 NOT DETECTED NOT DETECTED Final   Metapneumovirus NOT DETECTED NOT DETECTED Final   Rhinovirus / Enterovirus NOT DETECTED NOT DETECTED Final   Influenza A NOT DETECTED NOT DETECTED Final   Influenza B NOT DETECTED NOT DETECTED Final   Parainfluenza Virus 1 NOT DETECTED NOT DETECTED Final   Parainfluenza Virus 2 NOT DETECTED NOT DETECTED Final   Parainfluenza Virus 3 NOT DETECTED NOT DETECTED Final   Parainfluenza Virus 4 NOT DETECTED NOT DETECTED Final   Respiratory Syncytial Virus NOT DETECTED NOT DETECTED Final   Bordetella pertussis NOT DETECTED NOT DETECTED Final   Chlamydophila pneumoniae NOT DETECTED NOT DETECTED Final   Mycoplasma pneumoniae NOT DETECTED NOT DETECTED Final    Comment: Performed at [facility], [address] [place], [place] [address]  MRSA PCR Screening     Status: None    Collection Time: 09/08/18  4:50 PM  Result Value Ref Range Status   MRSA by PCR NEGATIVE NEGATIVE Final    Comment:        The GeneXpert MRSA Assay (FDA approved for NASAL specimens only), is one component of a comprehensive MRSA colonization surveillance program. It is not intended to diagnose MRSA infection nor to guide or monitor treatment for MRSA infections. Performed at [facility], [address], [place], [place] [address]      Medications:   . carvedilol  6.25 mg Oral BID WC  . chlorhexidine  15 mL Mouth Rinse BID  . Chlorhexidine Gluconate Cloth  6 each Topical Daily  . lidocaine  1 patch Transdermal Q24H  . mouth rinse  15 mL Mouth Rinse q12n4p  . sodium chloride flush  3 mL Intravenous Q12H   Continuous Infusions: . fluconazole (DIFLUCAN) IV 100 mg (09/11/18 0818)      LOS: 3 days   Albertine Grates  [facility]  09/11/2018, 1:51 PM

## 2018-09-11 NOTE — Progress Notes (Signed)
Subjective: Sitting comfortably in chair. On trach collar.  Objective: Vital signs in last 24 hours: Temp:  [98.3 F (36.8 C)-99.4 F (37.4 C)] 98.5 F (36.9 C) (03/19 0817) Pulse Rate:  [36-78] 74 (03/19 0738) Resp:  [12-22] 14 (03/19 0738) BP: (122-143)/(52-113) 135/61 (03/19 0738) SpO2:  [95 %-100 %] 98 % (03/19 0738) FiO2 (%):  [28 %] 28 % (03/19 0738)  Constitutional:NAD, calm Eyes: His pupils are equal, round, reactive to light. Extraocular motion is intact.  Ears: Examination of the ears shows normal auricles and external auditory canals bilaterally.  Nose: Nasal examination shows normal mucosa, septum, turbinates.  Face: Facial examination shows no asymmetry. Palpation of the face elicit no significant tenderness.  Mouth: Oral cavity examination shows no mucosal lacerations. No significant trismus is noted.  Neck: Trach midline with good air movement. No bleeding. Neuro: Cranial nerves 2-12 are all grossly intact. Respiratory:clear to auscultation bilaterally, no wheezing, no crackles. No accessory muscle use.  Musculoskeletal:no clubbing / cyanosis. No joint deformity upper and lower extremities.  Skin:no significant rashes, lesions, ulcers. Warm, dry, well-perfused. Psychiatric:Alert and oriented x 3. Calm, cooperative.   Recent Labs    09/11/18 0238  WBC 15.5*  HGB 10.9*  HCT 33.4*  PLT 234   Recent Labs    09/11/18 0238  NA 139  K 3.8  CL 110  CO2 21*  GLUCOSE 134*  BUN 34*  CREATININE 0.90  CALCIUM 8.4*    Medications:  I have reviewed the patient's current medications. Scheduled: . carvedilol  6.25 mg Oral BID WC  . chlorhexidine  15 mL Mouth Rinse BID  . Chlorhexidine Gluconate Cloth  6 each Topical Daily  . dexamethasone  8 mg Intravenous Q8H  . lidocaine  1 patch Transdermal Q24H  . mouth rinse  15 mL Mouth Rinse q12n4p  . sodium chloride flush  3 mL Intravenous Q12H   Continuous: . clindamycin (CLEOCIN) IV 300 mg (09/11/18 0252)   . fluconazole (DIFLUCAN) IV 100 mg (09/11/18 0818)    Assessment/Plan: POD #3 s/p tracheostomy to treat his significant recurrent upper airway obstruction. - Trach changed to an uncuffed #6 Shiley. - Doing well on trach collar. - Swallowing/PMV evaluation per speech. Advance diet as tolerated. Janina Mayo care teaching and home healthcare, in anticipation of discharge.   LOS: 3 days   Adante Courington W Manda Holstad 09/11/2018, 9:14 AM

## 2018-09-11 NOTE — Progress Notes (Addendum)
  Speech Language Pathology Treatment: Dysphagia;Passy Muir Speaking valve  Patient Details Name: Clarence Bradshaw MRN: [id number] DOB: Nov 26, 1945 Today's Date: 09/11/2018 Time: 2952-8413 SLP Time Calculation (min) (ACUTE ONLY): 38 min  Assessment / Plan / Recommendation Clinical Impression  Pt, alert and upright in chair, seen for PMSV tolerance and PO readiness. With valve placed, pt able to redirect air to cough up secretions to mouth and attain adequate phonation for speech. Vocal quality was slightly strained at times when pt ran out of breath when speaking. Pt educated on chunking strategy which will enable him to have enough breath support on short utterances, increasing overall intelligibility. Overall intelligibility in conversation was 90-100% with implementation of this strategy. Pt tolerated valve placement for ~40 minutes and displayed no signs of distress, back pressure or change in vitals. Provided teaching and visual feedback on placement and removal of valve, which he was able to demonstrate. He was also able to reteach basic valve precautions and cleaning procedures to SLP with min- no verbal cues. Recommend pt wear PMSV during all waking hours and meal intake. Will f/u for tolerance and education.  Pt observed with thin liquid, puree and regular solid POs, displaying no s/sx of aspiration. He also passed 3oz water test, denying any discomfort or pain with swallowing. Pt is independent and aware of what foods he can/can't eat and how to prepare his food for safe intake without dentures. Recommend regular, thin diet. Will f/u for tolerance.    HPI HPI: Clarence Bradshaw is a 73 y.o. male with medical history significant for COPD and hypertension, presenting to the emergency department with shortness of breath and pain with swallowing.  Per MD note patient admitted in December 2017 with supraglottic airway stenosis secondary to laryngeal swelling and made a full recovery per ptreport, but over  the past 2 months, has developed recurrent shortness of breath and odynophagia. CT that demonstrated high-grade/critical stenosis of the supraglottic larynx with generalized supraglottic soft tissue thickening, almost identical to the scan from December 2017. ENT note reported reccurrent upper respiratory obstruction secondary to laryngeal edema> trach performed 3/16. CXR 3/15 No active disease. No prior ST notes found.       SLP Plan  Continue with current plan of care       Recommendations  Diet recommendations: Regular;Thin liquid Liquids provided via: Cup;Straw Medication Administration: Whole meds with liquid Supervision: Patient able to self feed Compensations: Slow rate;Small sips/bites Postural Changes and/or Swallow Maneuvers: Seated upright 90 degrees;Upright 30-60 min after meal      Patient may use Passy-Muir Speech Valve: During PO intake/meals;During all waking hours (remove during sleep) PMSV Supervision: Intermittent         Oral Care Recommendations: Oral care BID Follow up Recommendations: None SLP Visit Diagnosis: Dysphagia, unspecified (R13.10);Aphonia (R49.1) Plan: Continue with current plan of care       GO                Gardiner Ramus, Student SLP 09/11/2018, 11:31 AM

## 2018-09-11 NOTE — TOC Initial Note (Addendum)
Transition of Care [facility]) - Initial/Assessment Note    Patient Details  Name: Clarence Bradshaw MRN: [id number] Date of Birth: Aug 19, 1945  Transition [facility]) CM/SW Contact:    Cherylann Parr, RN Phone Number: 09/11/2018, 1:10 PM  Clinical Narrative:     PTA independent from home with roommate.  Pt confirms he has PCP and denied barriers with paying for medications.  Pt in agreement with HHRN if needed at discharge - Pt provided medicare.gov HH/DME list for choice - pt chose [facility] for DME.- tentative referral accepted awaiting orders that include oxygen.   [facility] chosen for [employer] declined due to pt having trach.  Pts second choice was [facility] - agency contacted and liaison to follow back up with CM  CM requested for bedside nurse to arrange [facility] resp therapist to train pt and caregiver for home trach care.   [facility] doesn't require overrnight  training of caregiver for trach discharge.  [facility] will follow up with resp therapy to confirm training is complete for pt and caregiver.  [facility] will need a 24 hour notice of discharge to arrange supplies.   Pt will discharge to the following address;  [address], [place] [address]             Expected Discharge Plan: Home w Home Health Services Barriers to Discharge: Other (comment)(awating stability with new trach)   Patient Goals and CMS Choice Patient states their goals for this hospitalization and ongoing recovery are:: (Pt said he just ready to get back home,no goal verbalized) CMS Medicare.gov Compare Post Acute Care list provided to:: Patient Choice offered to / list presented to : Patient  Expected Discharge Plan and Services Expected Discharge Plan: Home w Home Health Services   Discharge Planning Services: CM Consult Post Acute Care Choice: Durable Medical Equipment, Home Health Living arrangements for the past 2 months: Single Family Home                          Prior Living Arrangements/Services Living arrangements  for the past 2 months: Single Family Home Lives with:: Roommate Patient language and need for interpreter reviewed:: Yes Do you feel safe going back to the place where you live?: Yes      Need for Family Participation in Patient Care: Yes (Comment)([name]) Care giver support system in place?: Yes (comment)(roomate)   Criminal Activity/Legal Involvement Pertinent to Current Situation/Hospitalization: No - Comment as needed  Activities of Daily Living Home Assistive Devices/Equipment: None ADL Screening (condition at time of admission) Patient's cognitive ability adequate to safely complete daily activities?: Yes Is the patient deaf or have difficulty hearing?: No Does the patient have difficulty seeing, even when wearing glasses/contacts?: No Does the patient have difficulty concentrating, remembering, or making decisions?: No Patient able to express need for assistance with ADLs?: No Does the patient have difficulty dressing or bathing?: No Independently performs ADLs?: Yes (appropriate for developmental age) Does the patient have difficulty walking or climbing stairs?: No Weakness of Legs: None Weakness of Arms/Hands: None  Permission Sought/Granted                  Emotional Assessment     Affect (typically observed): Accepting, Adaptable Orientation: : Oriented to Self, Oriented to Place, Oriented to  Time, Oriented to Situation   Psych Involvement: No (comment)  Admission diagnosis:  Stridor [R06.1] SOB (shortness of breath) [R06.02] Patient Active Problem List   Diagnosis Date Noted  .  Laryngitis 09/07/2018  . Hypertension 09/07/2018  . Hyponatremia 09/07/2018  . 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 Laryngeal spasm 07/05/2016  . Stridor 07/05/2016  . Acute respiratory failure with hypoxia (HCC)   . Acute pharyngitis 06/05/2016  . Hypomagnesemia 06/05/2016  . Hypokalemia 06/05/2016  . On mechanically assisted ventilation (HCC) 06/05/2016  . COPD (chronic  obstructive pulmonary disease) (HCC) 06/05/2016  . Tobacco use disorder 06/05/2016  . Carotid artery stenosis 06/05/2016   PCP:  [facility] Pharmacy:   [facility] - [place], [place] - [address] [place] [address] Phone: [phone] Fax: [phone]     Social Determinants of Health (SDOH) Interventions    Readmission Risk Interventions No flowsheet data found.

## 2018-09-12 DIAGNOSIS — J040 Acute laryngitis: Secondary | ICD-10-CM

## 2018-09-12 DIAGNOSIS — J386 Stenosis of larynx: Secondary | ICD-10-CM

## 2018-09-12 DIAGNOSIS — J449 Chronic obstructive pulmonary disease, unspecified: Secondary | ICD-10-CM

## 2018-09-12 LAB — BASIC METABOLIC PANEL
ANION GAP: 9 (ref 5–15)
BUN: 31 mg/dL — ABNORMAL HIGH (ref 8–23)
CHLORIDE: 106 mmol/L (ref 98–111)
CO2: 22 mmol/L (ref 22–32)
Calcium: 8.1 mg/dL — ABNORMAL LOW (ref 8.9–10.3)
Creatinine, Ser: 0.84 mg/dL (ref 0.61–1.24)
GFR calc Af Amer: >60 mL/min (ref >60)
GFR calc non Af Amer: >60 mL/min (ref >60)
Glucose, Bld: 124 mg/dL — ABNORMAL HIGH (ref 70–99)
Potassium: 3.4 mmol/L — ABNORMAL LOW (ref 3.5–5.1)
Sodium: 137 mmol/L (ref 135–145)

## 2018-09-12 LAB — CBC WITH DIFFERENTIAL/PLATELET
Abs Immature Granulocytes: 0.25 10*3/uL — ABNORMAL HIGH (ref 0.00–0.07)
Basophils Absolute: 0 10*3/uL (ref 0.0–0.1)
Basophils Relative: 0 %
Eosinophils Absolute: 0 10*3/uL (ref 0.0–0.5)
Eosinophils Relative: 0 %
HCT: 33.8 % — ABNORMAL LOW (ref 39.0–52.0)
HEMOGLOBIN: 11 g/dL — AB (ref 13.0–17.0)
Immature Granulocytes: 2 %
LYMPHS PCT: 8 %
Lymphs Abs: 1.1 10*3/uL (ref 0.7–4.0)
MCH: 27.8 pg (ref 26.0–34.0)
MCHC: 32.5 g/dL (ref 30.0–36.0)
MCV: 85.4 fL (ref 80.0–100.0)
Monocytes Absolute: 1.3 10*3/uL — ABNORMAL HIGH (ref 0.1–1.0)
Monocytes Relative: 9 %
NEUTROS PCT: 81 %
Neutro Abs: 12.1 10*3/uL — ABNORMAL HIGH (ref 1.7–7.7)
Platelets: 229 10*3/uL (ref 150–400)
RBC: 3.96 MIL/uL — ABNORMAL LOW (ref 4.22–5.81)
RDW: 14.2 % (ref 11.5–15.5)
WBC: 14.8 10*3/uL — ABNORMAL HIGH (ref 4.0–10.5)
nRBC: 0 % (ref 0.0–0.2)

## 2018-09-12 LAB — MAGNESIUM: Magnesium: 2.2 mg/dL (ref 1.7–2.4)

## 2018-09-12 LAB — GLUCOSE, CAPILLARY
Glucose-Capillary: 157 mg/dL — ABNORMAL HIGH (ref 70–99)
Glucose-Capillary: 97 mg/dL (ref 70–99)
Glucose-Capillary: 115 mg/dL — ABNORMAL HIGH (ref 70–99)

## 2018-09-12 MED ORDER — CARVEDILOL 6.25 MG PO TABS: 6.2500 mg | ORAL_TABLET | Freq: Two times a day (BID) | ORAL | 0 refills | Status: AC

## 2018-09-12 MED ORDER — OXYMETAZOLINE HCL 0.05 % NA SOLN: 1.0000 | Freq: Once | NASAL | 0 refills | Status: AC | PRN

## 2018-09-12 MED ORDER — POTASSIUM CHLORIDE CRYS ER 20 MEQ PO TBCR
40.0000 meq | EXTENDED_RELEASE_TABLET | Freq: Once | ORAL | Status: AC
  Administered 2018-09-12: 40 meq via ORAL
  Filled 2018-09-12: qty 2

## 2018-09-12 MED ORDER — NYSTATIN 100000 UNIT/ML MT SUSP: 5.0000 mL | Freq: Four times a day (QID) | OROMUCOSAL | 0 refills | Status: AC

## 2018-09-12 NOTE — TOC Initial Note (Addendum)
Transition of Care [facility]) - Initial/Assessment Note    Patient Details  Name: Clarence Bradshaw MRN: [id number] Date of Birth: [birth year]  Transition of Care [facility]) CM/SW Contact:    Cherylann Parr, RN Phone Number: 09/12/2018, 5:19 PM    Update:   [facility] will accept pt and will start care tomorrow 09/13/18 - attending and pt in agreement.  CM confirmed with pts roommate Thelma Barge that [facility] delivered all required trach supplies.  Pt and roommate will receive teaching from [facility] Resp therapy this evening at 5:30pm prior to discharge.  CM signing off  [facility] will accept pt however initiation of services will not be until Monday - CM reached out to attending, [facility], [facility] and [facility] all declined to take pt  [facility] declined pt,  Referral given to [facility] - agency declined.  Referall given to [facility], [facility],  [facility] and [facility].  [facility] is not in network with insurance.  Pt informed CM that he has VA benefits however he is not interested in using.  Barrier with HH agency acceptance include; [place] based [facility] [employer] and fresh trach - barriers communicated  to both attending an pt.  Clinical Narrative:     PTA independent from home with roommate.  Pt confirms he has PCP and denied barriers with paying for medications.  Pt in agreement with HHRN if needed at discharge - Pt provided medicare.gov HH/DME list for choice - pt chose [facility] for DME.- tentative referral accepted awaiting orders that include oxygen.   [facility] chosen for [employer] declined due to pt having trach.  Pts second choice was [facility] - agency contacted and liaison to follow back up with CM  CM requested for bedside nurse to arrange [facility] resp therapist to train pt and caregiver for home trach care.   [facility] doesn't require overrnight  training of caregiver for trach discharge.  [facility] will follow up with resp therapy to confirm training is complete for pt and caregiver.  [facility] will need a 24 hour notice of discharge to arrange  supplies.   Pt will discharge to the following address;  [address], [place] [address]             Expected Discharge Plan: Home w Home Health Services Barriers to Discharge: No Barriers Identified   Patient Goals and CMS Choice Patient states their goals for this hospitalization and ongoing recovery are:: (Pt said he just ready to get back home,no goal verbalized) CMS Medicare.gov Compare Post Acute Care list provided to:: Patient Choice offered to / list presented to : Patient  Expected Discharge Plan and Services Expected Discharge Plan: Home w Home Health Services   Discharge Planning Services: CM Consult Post Acute Care Choice: Durable Medical Equipment, Home Health Living arrangements for the past 2 months: Single Family Home Expected Discharge Date: 09/12/18               DME Arranged: Janina Mayo supplies DME Agency: [facility] HH Arranged: RN HH Agency: [facility]  Prior Living Arrangements/Services Living arrangements for the past 2 months: Single Family Home Lives with:: Roommate Patient language and need for interpreter reviewed:: Yes Do you feel safe going back to the place where you live?: Yes      Need for Family Participation in Patient Care: Yes (Comment)([name]) Care giver support system in place?: Yes (comment)(roomate)   Criminal Activity/Legal Involvement Pertinent to Current Situation/Hospitalization: No - Comment as needed  Activities of Daily Living Home Assistive Devices/Equipment: None ADL Screening (  condition at time of admission) Patient's cognitive ability adequate to safely complete daily activities?: Yes Is the patient deaf or have difficulty hearing?: No Does the patient have difficulty seeing, even when wearing glasses/contacts?: No Does the patient have difficulty concentrating, remembering, or making decisions?: No Patient able to express need for assistance with ADLs?: No Does the patient have difficulty dressing or  bathing?: No Independently performs ADLs?: Yes (appropriate for developmental age) Does the patient have difficulty walking or climbing stairs?: No Weakness of Legs: None Weakness of Arms/Hands: None  Permission Sought/Granted                  Emotional Assessment     Affect (typically observed): Accepting, Adaptable Orientation: : Oriented to Self, Oriented to Place, Oriented to  Time, Oriented to Situation   Psych Involvement: No (comment)  Admission diagnosis:  Stridor [R06.1] SOB (shortness of breath) [R06.02] Patient Active Problem List   Diagnosis Date Noted  . Chronic systolic CHF (congestive heart failure) (HCC)   . Laryngitis 09/07/2018  . Hypertension 09/07/2018  . Hyponatremia 09/07/2018  . 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 Laryngeal spasm 07/05/2016  . Stridor 07/05/2016  . Acute respiratory failure with hypoxia (HCC)   . Acute pharyngitis 06/05/2016  . Hypomagnesemia 06/05/2016  . Hypokalemia 06/05/2016  . On mechanically assisted ventilation (HCC) 06/05/2016  . COPD (chronic obstructive pulmonary disease) (HCC) 06/05/2016  . Smoking 06/05/2016  . Carotid artery stenosis 06/05/2016   PCP:  [facility] Pharmacy:   [facility] - [place], [place] - [address] [place] [address] Phone: [phone] Fax: [phone]     Social Determinants of Health (SDOH) Interventions    Readmission Risk Interventions No flowsheet data found.

## 2018-09-12 NOTE — Progress Notes (Signed)
  Speech Language Pathology Treatment: Hillary Bow Speaking valve;Dysphagia  Patient Details Name: Clarence Bradshaw MRN: [id number] DOB: 09-03-1945 Today's Date: 09/12/2018 Time: 1194-1740 SLP Time Calculation (min) (ACUTE ONLY): 9 min  Assessment / Plan / Recommendation Clinical Impression  Pt was wearing his PMV upon SLP arrival, stating that he had been wearing it since waking up this morning. He donned and doffed the valve with Mod I. He also verbally described with Mod I how to clean/maintain PMV and what precautions to take, such as when to remove it. Pt consumed thin liquids with no overt signs of difficulty. Pt has no subjective complaints about swallowing, and says his odynophagia is gone. Recommend continuing PMV during waking hours as tolerated and regular diet, thin liquids. All questions were answered in anticipation of discharge soon.   HPI HPI: Shurman Pritchard is a 73 y.o. male with medical history significant for COPD and hypertension, presenting to the emergency department with shortness of breath and pain with swallowing.  Per MD note patient admitted in December 2017 with supraglottic airway stenosis secondary to laryngeal swelling and made a full recovery per ptreport, but over the past 2 months, has developed recurrent shortness of breath and odynophagia. CT that demonstrated high-grade/critical stenosis of the supraglottic larynx with generalized supraglottic soft tissue thickening, almost identical to the scan from December 2017. ENT note reported reccurrent upper respiratory obstruction secondary to laryngeal edema> trach performed 3/16. CXR 3/15 No active disease. No prior ST notes found.       SLP Plan  Continue with current plan of care       Recommendations  Diet recommendations: Regular;Thin liquid Liquids provided via: Cup;Straw Medication Administration: Whole meds with liquid Supervision: Patient able to self feed Compensations: Slow rate;Small sips/bites Postural  Changes and/or Swallow Maneuvers: Seated upright 90 degrees;Upright 30-60 min after meal      Patient may use Passy-Muir Speech Valve: During PO intake/meals;During all waking hours (remove during sleep) PMSV Supervision: Intermittent         Oral Care Recommendations: Oral care BID Follow up Recommendations: None SLP Visit Diagnosis: Dysphagia, unspecified (R13.10);Aphonia (R49.1) Plan: Continue with current plan of care       GO                Virl Axe Chany Woolworth 09/12/2018, 1:45 PM  [name], M.A. CCC-SLP Acute Herbalist [phone] Office [phone]

## 2018-09-12 NOTE — Progress Notes (Addendum)
RT to educate pt on how to suction, clean inner cannula and use Ambu bag.Pt educated on how to suction self.  Pt also educated on how to take inner cannula out and clean it maintaining good sterile procedure throughout and place back in trach. Pt also educated on how to use the ambu-bag. Pt as well as roommate and roommate's son seem to have a good understanding on how to do trach care and and feel comfortable performing trach care at home if needed. Pt also educated on the importance to call 911 in case of an emergency. RT called Dr. Roda Shutters after trach education was completed.

## 2018-09-12 NOTE — Progress Notes (Signed)
Pt with discharge order to go home pending roommate and roommates son arrival for education regarding trach care with respiratory therapist. Both arrived just before 7pm. RT notified they are ready for teaching. Some questions arose if all equipment was delivered. After trach education finished I spoke with pt, roommate and roommates son about comfort level and understanding of trach care. They all agreed and felt comfortable with caring for pt. Box of supplies at bedside (portable suction set with catheters and yankauer) reviewed.  Roommate explained that they delivered a larger suction setup and everything was ready to use along with "a lot of boxes of supplies". Per pt, home health RN is coming by "first thing in the morning". I educated pt on importance of not overexerting himself and when to call 911 in case of emergency. Pt and visitors all agreeable to plan. Dr Roda Shutters notified and ok with d/c as long as guests are comfortable with trach care and have all equipment needed. Discharge paperwork completed during day shift. Pt discharged off the unit with all belongings.

## 2018-09-12 NOTE — Progress Notes (Signed)
Subjective: Pt sitting comfortably in chair. Talking well with the [facility] valve.  Objective: Vital signs in last 24 hours: Temp:  [97.7 F (36.5 C)-97.9 F (36.6 C)] 97.7 F (36.5 C) (03/20 0726) Pulse Rate:  [57-99] 78 (03/20 0726) Resp:  [14-22] 17 (03/20 0726) BP: (119-156)/(45-140) 146/56 (03/20 0726) SpO2:  [93 %-100 %] 95 % (03/20 0726) FiO2 (%):  [28 %] 28 % (03/19 1109) Weight:  [70.4 kg] 70.4 kg (03/20 0500)  Constitutional:NAD, calm Eyes: His pupils are equal, round, reactive to light. Extraocular motion is intact.  Ears: Examination of the ears shows normal auricles and external auditory canals bilaterally.  Nose: Nasal examination shows normal mucosa, septum, turbinates.  Face: Facial examination shows no asymmetry. Palpation of the face elicit no significant tenderness.  Mouth: Oral cavity examination shows no mucosal lacerations. No significant trismus is noted.  Neck: Trach midline with good air movement. No bleeding. Neuro: Cranial nerves 2-12 are all grossly intact. Musculoskeletal:no clubbing / cyanosis. No joint deformity upper and lower extremities.  Skin:no significant rashes, lesions, ulcers. Warm, dry, well-perfused. Psychiatric:Alert and oriented x 3. Calm, cooperative.   Recent Labs    09/11/18 0238 09/12/18 0252  WBC 15.5* 14.8*  HGB 10.9* 11.0*  HCT 33.4* 33.8*  PLT 234 229   Recent Labs    09/11/18 0238 09/12/18 0252  NA 139 137  K 3.8 3.4*  CL 110 106  CO2 21* 22  GLUCOSE 134* 124*  BUN 34* 31*  CREATININE 0.90 0.84  CALCIUM 8.4* 8.1*    Medications:  I have reviewed the patient's current medications. Scheduled: . carvedilol  6.25 mg Oral BID WC  . chlorhexidine  15 mL Mouth Rinse BID  . Chlorhexidine Gluconate Cloth  6 each Topical Daily  . fluconazole  100 mg Oral Daily  . lidocaine  1 patch Transdermal Q24H  . mouth rinse  15 mL Mouth Rinse q12n4p  . nystatin  5 mL Oral QID  . sodium chloride flush  3 mL  Intravenous Q12H   Continuous:   Assessment/Plan: POD #4s/p tracheostomy to treat his significant recurrentupper airway obstruction. - Trach changed to an uncuffed #6 Shiley yesterday.  - Doing well on trach collar. - Regular diet. Janina Mayo care teaching and home healthcare. - Pt may follow up with me in approximately 2-3 weeks.    LOS: 4 days   Kawanna Christley W Glada Wickstrom 09/12/2018, 8:42 AM

## 2018-09-12 NOTE — Discharge Summary (Addendum)
Discharge Summary  Clarence Bradshaw:[id number] DOB: Feb 23, 1946  PCP: [facility]  Admit date: 09/07/2018 Discharge date: 09/12/2018  Time spent: , more than 50% time spent on coordination of care.   Recommendations for Outpatient Follow-up:  1. F/u with PCP within a week  for hospital discharge follow up, repeat cbc/bmp at follow up 2. F/u with ENT Dr Suszanne Conners 3. 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    Discharge Condition: stable  Diet recommendation: heart healthy  Filed Weights   09/09/18 0213 09/10/18 0500 09/12/18 0500  Weight: 72.9 kg 72.3 kg 70.4 kg    History of present illness: (per admitting MD Dr Antionette Char) PCP: [facility]   Patient coming from: Home   Chief Complaint: SOB, odynophagia   HPI: Clarence Bradshaw is a 73 y.o. male with medical history significant for COPD and hypertension, now presenting to the emergency department for evaluation of shortness of breath and pain with swallowing.  Patient was admitted in December 2017 with supraglottic airway stenosis secondary to laryngeal swelling, suspected to be viral in etiology.  He made a full recovery per his report, but over the past 2 months, has developed recurrent shortness of breath and odynophagia.  He denies any fevers, chills, cough, or wheezing.  He reports that he has continued to eat and drink, with some pain, but without choking or vomiting.  [facility] ED Course: Upon arrival to the ED, patient is found to be afebrile, saturating well on room air, and with  vitals otherwise normal.  He had a CT that demonstrates high-grade/critical stenosis of the supraglottic larynx with generalized supraglottic soft tissue thickening, almost identical to the scan from December 2017.  There was no ENT coverage at the initial facility, he was given 10 mg IV Decadron, and he was transferred to [facility] ED.  Here, he has a chemistry panel with hyponatremia and a CBC with leukocytosis to 15,900 and a mild normocytic anemia.  ENT was consulted by the ED physician and recommends observation on the medical service with Decadron 8 mg IV every 8 hours and respiratory virus panel.  Patient was given 8 mg of IV Decadron and respiratory virus panel was ordered.  He remains hemodynamically stable, is not in any acute distress, and will be observed for ongoing evaluation and management.  Hospital Course:  Principal Problem:   Supraglottic stenosis Active Problems:   COPD (chronic obstructive pulmonary disease) (HCC)   Smoking   Laryngitis   Hypertension   Hyponatremia   Chronic systolic CHF (congestive heart failure) (HCC)   Supraglottic stenosis/ Laryngitis: Virus panel was negative.  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 Case discussed with ENT Dr Suszanne Conners who recommended d/c steroids and clindamycin Patient is doing well tolerating passy-muir valve, speech cleared him to regular diet Per Dr Suszanne Conners, patient can discharge home as long as home health is set up for trach care/devices. Patient is to follow up with Dr Suszanne Conners in a few weeks Case manager to set up home health    Oral thrush: He was previously on  a course of steroids and antibiotics, he is treated with IV Diflucan. Now able to take oral meds, change to oral, add on topical nystatin  COPD (chronic obstructive pulmonary disease) (HCC): not o2 dependent,  No wheezing , lung clear on exam continue home meds. Advise quit smoking  Essential hypertension: Blood pressure  stable, restarted his Coreg at a lower dose. Continue home meds norvasc, d/c home meds lisinopril.   Chronic systolic CHF -last EF 45-50% from 2017 -euvolemic on exam  Hypovolemic hyponatremia: Likely due to decreased oral intake start on normal saline has now resolved. Started back on regular diet today  H/o Carotid artery stenosis - h/o right carotid endarterectomy - Left ICA "waveform is suggestive of intracranial stenosis, in addition to the cervical stenosis." -advise quit smoking , continue statin, f/u with pcp.  Tobacco use disorder - counseled to quit    Family Communication:none Disposition Plan/Barrier to D/C: ENT cleared him to d/c home once home health set up.  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 If you have any questions about your discharge medications or the care you received while you were in the hospital after you are discharged, you can call the unit and asked to speak with the hospitalist on call if the hospitalist that took care of you is not available. Once you are discharged, your primary care physician will handle any further medical issues. Please note that NO REFILLS for any discharge medications will be authorized once you are discharged, as it is imperative that you return to your primary care physician (or establish a relationship with a primary care physician if you do not have one) for your aftercare needs so that they can reassess your need for medications and monitor your lab values.  Discharge Instructions    Diet - low sodium heart healthy    Complete by:  As directed    Increase activity slowly   Complete by:  As directed      Allergies as of 09/12/2018      Reactions   Penicillins Anaphylaxis   Did it involve swelling of the face/tongue/throat, SOB, or low BP? Yes Did it involve sudden or severe rash/hives, skin peeling, or any reaction on the inside of your mouth or nose? Unknown Did you need to seek medical attention at a hospital or doctor's office? Yes When did it last happen?unk If all above answers are NO, may proceed with cephalosporin use.   Demerol [meperidine] Nausea Only      Medication List    STOP taking these medications   atorvastatin 20 MG tablet Commonly known as:  LIPITOR   lisinopril 5 MG tablet Commonly known as:  PRINIVIL,ZESTRIL   sildenafil 100 MG tablet Commonly known as:  VIAGRA     TAKE these medications   acetaminophen 500 MG tablet Commonly known as:  TYLENOL Take 500 mg by mouth every 4 (four) hours as needed for moderate pain or headache.   albuterol 108 (90 Base) MCG/ACT inhaler Commonly known  as:  PROVENTIL HFA;VENTOLIN HFA Inhale 1-2 puffs into the lungs every 6 (six) hours as needed for wheezing or shortness of breath.   amLODipine 10 MG tablet Commonly known as:  NORVASC Take 10 mg by mouth daily.   budesonide-formoterol 160-4.5 MCG/ACT inhaler Commonly known as:  SYMBICORT Inhale 2 puffs into the lungs 2 (two) times daily.   carvedilol 6.25 MG tablet Commonly known as:  COREG Take 1 tablet (6.25 mg total) by mouth 2 (two) times daily with a meal. What changed:    medication strength  how much to take   fluticasone 50 MCG/ACT nasal spray Commonly known as:  FLONASE Place 2 sprays into both nostrils daily as needed for allergies or rhinitis.   guaiFENesin-dextromethorphan 100-10 MG/5ML syrup Commonly known as:  ROBITUSSIN DM Take 5 mLs by mouth every 4 (four) hours as needed for cough.   ibuprofen 600 MG tablet Commonly known as:   ADVIL,MOTRIN Take 600 mg by mouth 2 (two) times daily as needed for headache or moderate pain.   magnesium oxide 400 MG tablet Commonly known as:  MAG-OX Take 400 mg by mouth daily.   nystatin 100000 UNIT/ML suspension Commonly known as:  MYCOSTATIN Use as directed 5 mLs (500,000 Units total) in the mouth or throat 4 (four) times daily.   oxymetazoline 0.05 % nasal spray Commonly known as:  AFRIN Place 1 spray into both nostrils once as needed for congestion (Bedside procedure).   pantoprazole 40 MG tablet Commonly known as:  PROTONIX Take 40 mg by mouth daily.   simvastatin 40 MG tablet Commonly known as:  ZOCOR Take 20 mg by mouth daily.      Allergies  Allergen Reactions   Penicillins Anaphylaxis    Did it involve swelling of the face/tongue/throat, SOB, or low BP? Yes Did it involve sudden or severe rash/hives, skin peeling, or any reaction on the inside of your mouth or nose? Unknown Did you need to seek medical attention at a hospital or doctor's office? Yes When did it last happen?unk If all above answers are NO, may proceed with cephalosporin use.      Demerol [Meperidine] Nausea Only   Follow-up Information    [facility] Follow up in 1 week(s).   Why:  hospital discharge follow up, repeat cbc/bmp at follow up Contact information: [address] [place] [address] [phone]        Newman Pies, MD Follow up in 2 week(s).   Specialty:  Otolaryngology Why:  trach care Contact information: [address] [place] [address] [phone]            The results of significant diagnostics from this hospitalization (including imaging, microbiology, ancillary and laboratory) are listed below for reference.    Significant Diagnostic Studies: Dg Chest Port 1 View  Result Date: 09/07/2018 CLINICAL DATA:  Difficulty swallowing. Shortness of breath, congestion EXAM: PORTABLE CHEST 1 VIEW COMPARISON:   06/07/2016 FINDINGS: Prior CABG. Cardiomegaly. Increased interstitial markings in the lung bases compatible with scarring, stable. Mild hyperinflation. No acute bony abnormality. IMPRESSION: Hyperinflation. Stable interstitial markings in the lung bases, likely scarring. Mild cardiomegaly. No active disease. Electronically Signed   By: Charlett Nose M.D.   On: 09/07/2018 22:34    Microbiology: Recent Results (from the past 240 hour(s))  Respiratory Panel by PCR     Status: None   Collection Time: 09/07/18  8:37 PM  Result Value Ref Range Status   Adenovirus NOT  DETECTED NOT DETECTED Final   Coronavirus 229E NOT DETECTED NOT DETECTED Final    Comment: (NOTE) The Coronavirus on the Respiratory Panel, DOES NOT test for the novel  Coronavirus (2019 nCoV)    Coronavirus HKU1 NOT DETECTED NOT DETECTED Final   Coronavirus NL63 NOT DETECTED NOT DETECTED Final   Coronavirus OC43 NOT DETECTED NOT DETECTED Final   Metapneumovirus NOT DETECTED NOT DETECTED Final   Rhinovirus / Enterovirus NOT DETECTED NOT DETECTED Final   Influenza A NOT DETECTED NOT DETECTED Final   Influenza B NOT DETECTED NOT DETECTED Final   Parainfluenza Virus 1 NOT DETECTED NOT DETECTED Final   Parainfluenza Virus 2 NOT DETECTED NOT DETECTED Final   Parainfluenza Virus 3 NOT DETECTED NOT DETECTED Final   Parainfluenza Virus 4 NOT DETECTED NOT DETECTED Final   Respiratory Syncytial Virus NOT DETECTED NOT DETECTED Final   Bordetella pertussis NOT DETECTED NOT DETECTED Final   Chlamydophila pneumoniae NOT DETECTED NOT DETECTED Final   Mycoplasma pneumoniae NOT DETECTED NOT DETECTED Final    Comment: Performed at [facility], [address], [place], [place] [address]  MRSA PCR Screening     Status: None   Collection Time: 09/08/18  4:50 PM  Result Value Ref Range Status   MRSA by PCR NEGATIVE NEGATIVE Final    Comment:        The GeneXpert MRSA Assay (FDA approved for NASAL specimens only), is one component of  a comprehensive MRSA colonization surveillance program. It is not intended to diagnose MRSA infection nor to guide or monitor treatment for MRSA infections. Performed at [facility], [address] [place], [place] [address]      Labs: Basic Metabolic Panel: Recent Labs  Lab 09/07/18 2036 09/07/18 2041 09/08/18 0257 09/11/18 0238 09/12/18 0252  NA 131* 138 135 139 137  K 3.9 4.1 4.5 3.8 3.4*  CL 100  --  108 110 106  CO2 19*  --  20* 21* 22  GLUCOSE 123*  --  164* 134* 124*  BUN 17  --  17 34* 31*  CREATININE 1.17  --  1.24 0.90 0.84  CALCIUM 8.8*  --  8.9 8.4* 8.1*  MG  --   --   --  2.4 2.2   Liver Function Tests: Recent Labs  Lab 09/11/18 0238  AST 25  ALT 18  ALKPHOS 42  BILITOT 0.7  PROT 6.5  ALBUMIN 3.5   No results for input(s): LIPASE, AMYLASE in the last 168 hours. No results for input(s): AMMONIA in the last 168 hours. CBC: Recent Labs  Lab 09/07/18 2036 09/07/18 2041 09/08/18 0257 09/11/18 0238 09/12/18 0252  WBC 15.9*  --  14.0* 15.5* 14.8*  NEUTROABS 14.4*  --  12.8* 13.7* 12.1*  HGB 12.4* 12.2* 11.9* 10.9* 11.0*  HCT 36.9* 36.0* 35.4* 33.4* 33.8*  MCV 85.6  --  85.3 85.9 85.4  PLT 242  --  233 234 229   Cardiac Enzymes: No results for input(s): CKTOTAL, CKMB, CKMBINDEX, TROPONINI in the last 168 hours. BNP: BNP (last 3 results) No results for input(s): BNP in the last 8760 hours.  ProBNP (last 3 results) No results for input(s): PROBNP in the last 8760 hours.  CBG: Recent Labs  Lab 09/11/18 1135 09/11/18 1640 09/11/18 2159 09/12/18 0802 09/12/18 1126  GLUCAP 145* 130* 125* 157* 97       Signed:  Albertine Grates MD, PhD  [facility] 09/12/2018, 11:47 AM

## 2018-10-30 ENCOUNTER — Ambulatory Visit (INDEPENDENT_AMBULATORY_CARE_PROVIDER_SITE_OTHER): Payer: Medicare PPO | Admitting: Otolaryngology

## 2018-10-30 DIAGNOSIS — Z930 Tracheostomy status: Secondary | ICD-10-CM | POA: Diagnosis not present

## 2018-10-30 DIAGNOSIS — J386 Stenosis of larynx: Secondary | ICD-10-CM | POA: Diagnosis not present

## 2020-01-04 IMAGING — DX PORTABLE CHEST - 1 VIEW
1 series · 2 of 2 positions shown · non-contrast
Comparison: 06/07/2016

CLINICAL DATA: Difficulty swallowing. Shortness of breath,
congestion

EXAM:
PORTABLE CHEST 1 VIEW

[Series 1: chest · 0.14mm/px · 2 of 2 slices shown]
[im 1/2]
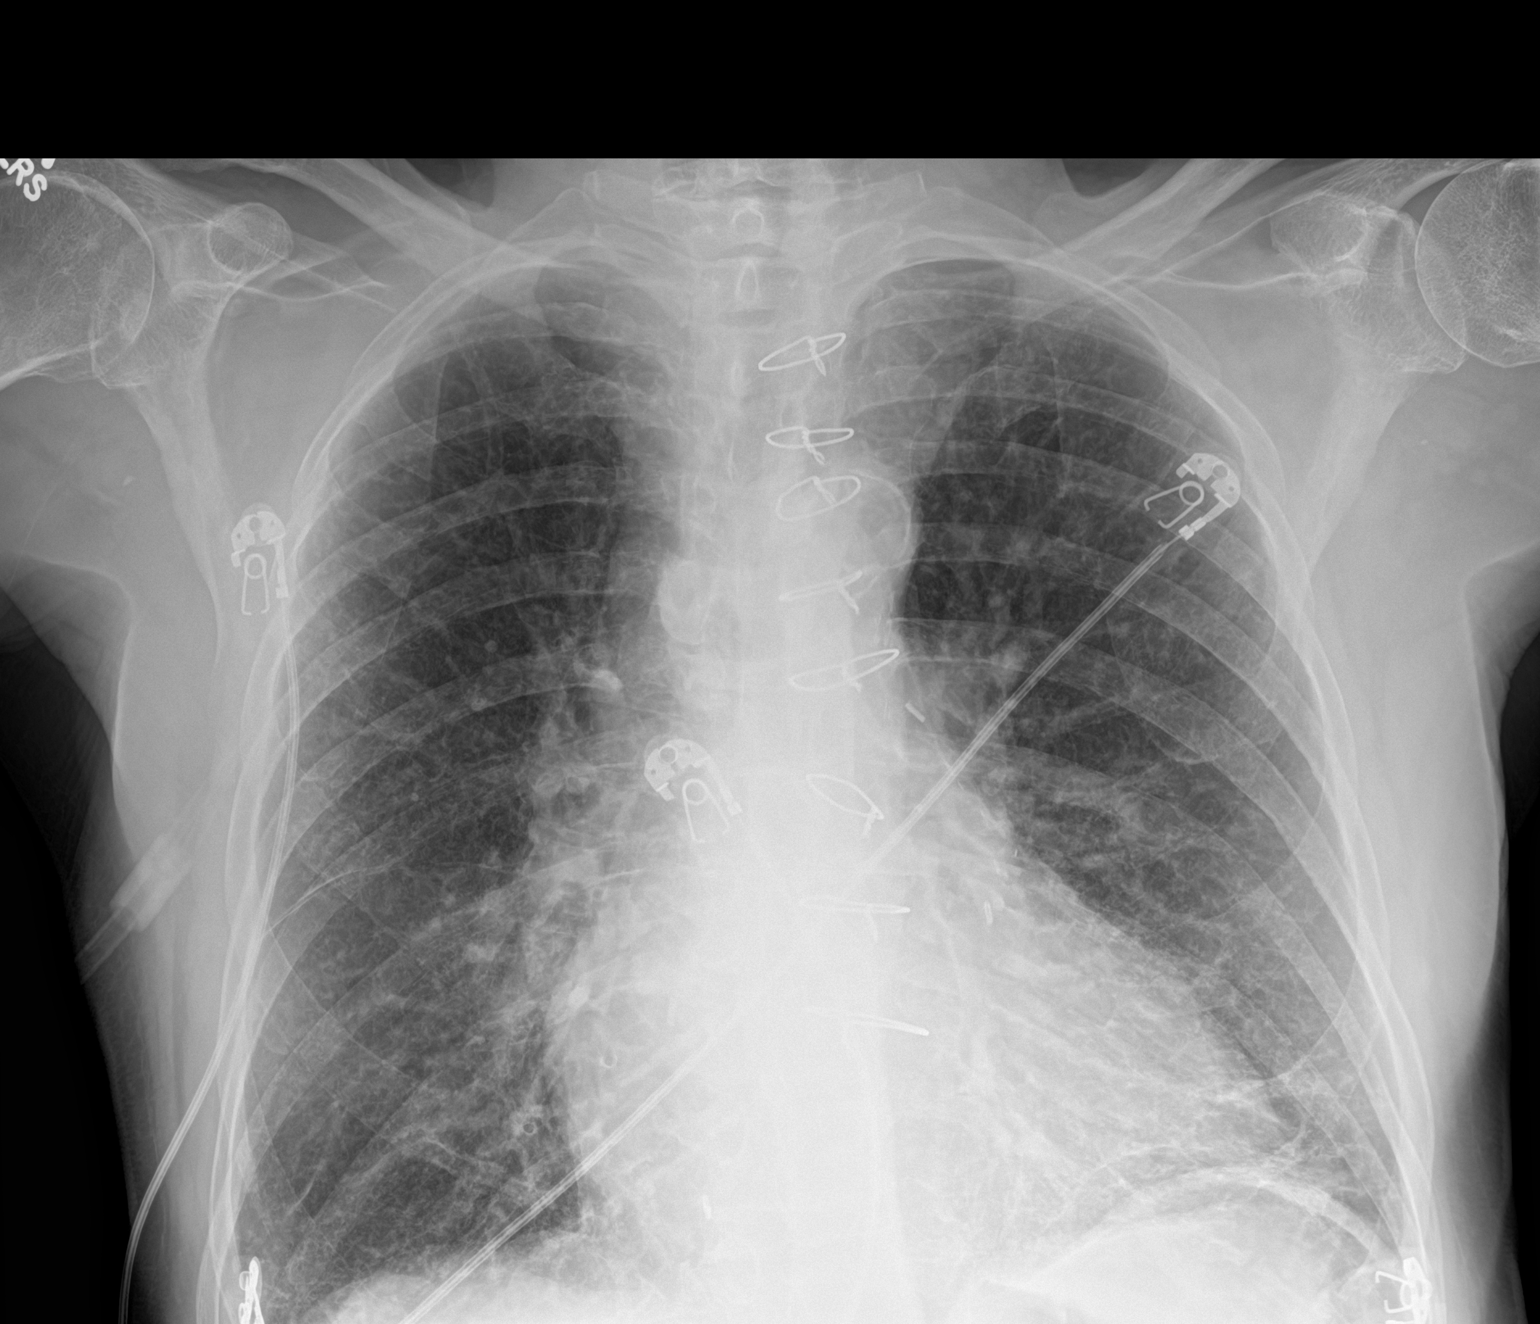
[im 2/2]
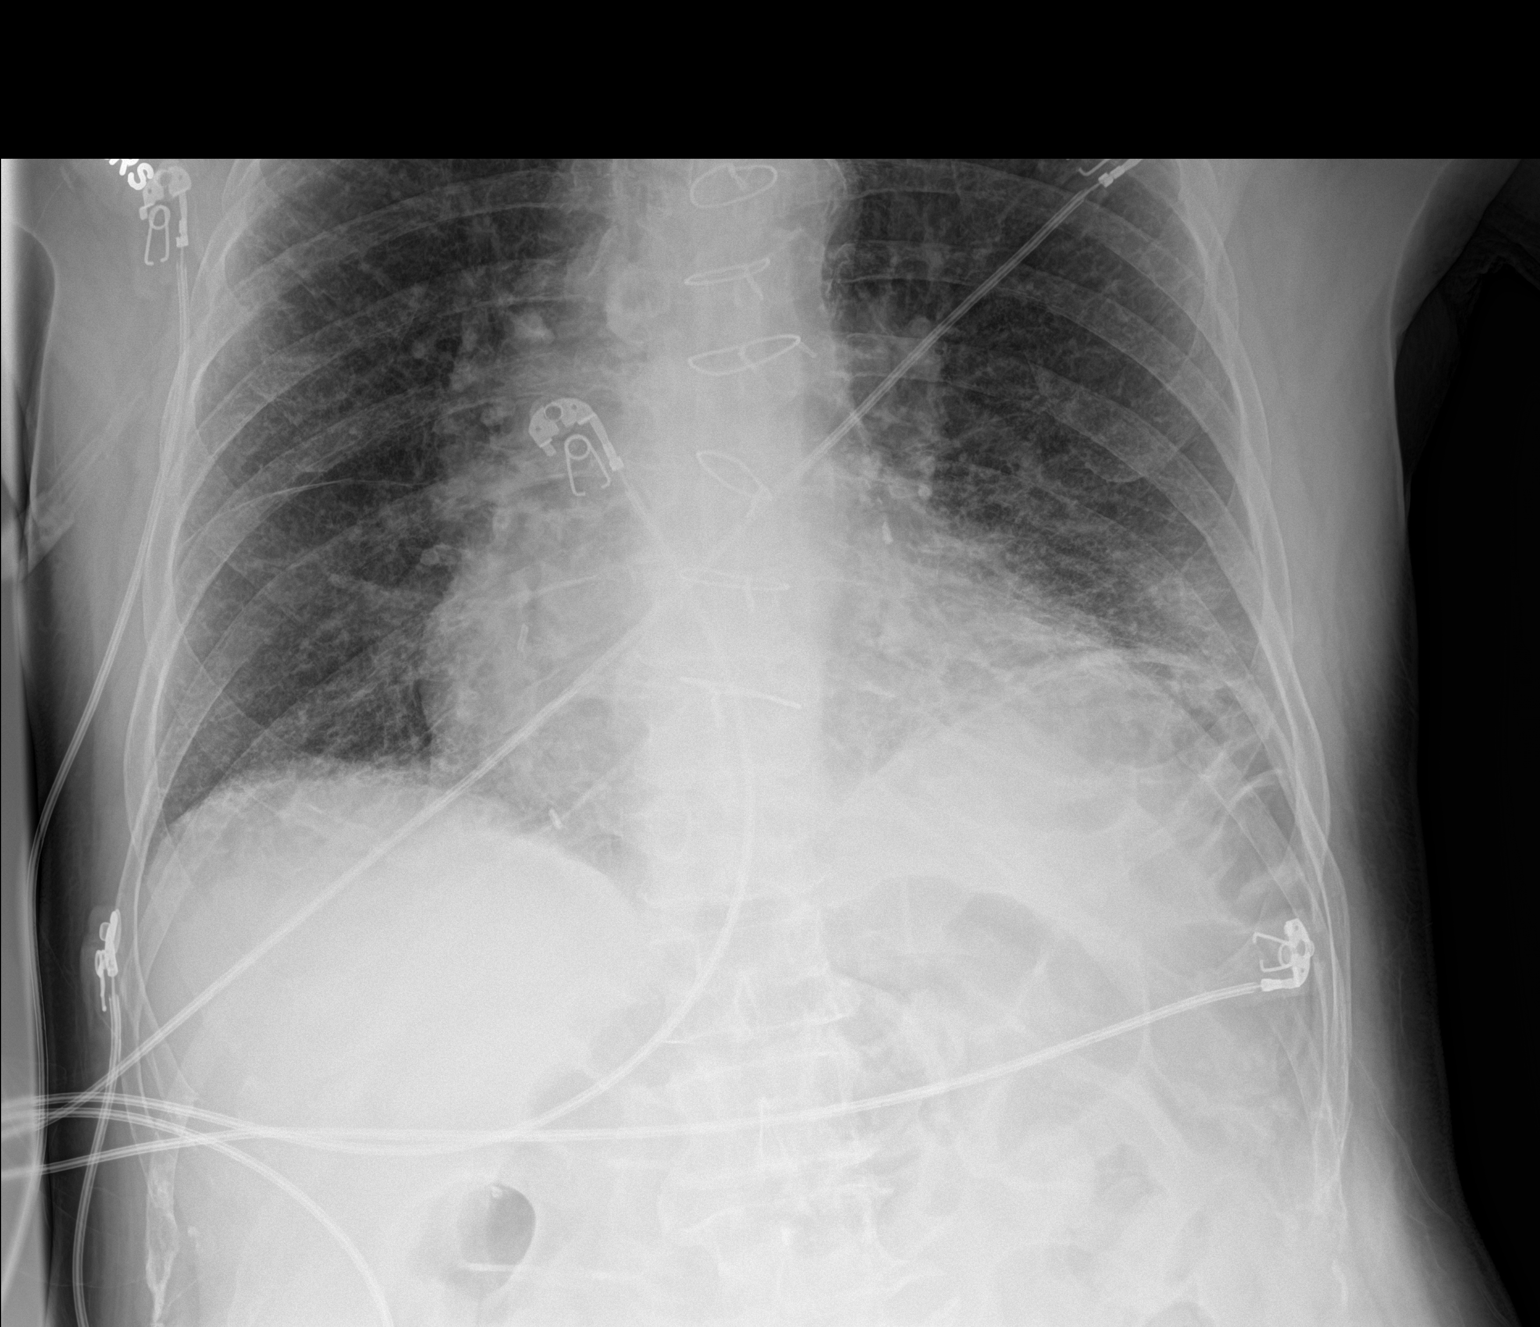

[2 of 2 positions shown; findings below may reference images not displayed]

FINDINGS: Prior CABG. Cardiomegaly. Increased interstitial markings in the
lung bases compatible with scarring, stable. Mild hyperinflation. No
acute bony abnormality.
IMPRESSION: Hyperinflation. Stable interstitial markings in the lung bases,
likely scarring. Mild cardiomegaly.

No active disease.

## 2021-09-23 DEATH — deceased
